# Patient Record
Sex: Female | Born: 1980 | Race: White | Hispanic: No | Marital: Single | Smoking: Former smoker
Health system: Southern US, Community
[De-identification: ages and names within clinical notes are randomized; demographics above are authoritative.]

## PROBLEM LIST (undated history)

## (undated) DIAGNOSIS — F419 Anxiety disorder, unspecified: Secondary | ICD-10-CM

## (undated) DIAGNOSIS — M4800 Spinal stenosis, site unspecified: Secondary | ICD-10-CM

## (undated) DIAGNOSIS — J45909 Unspecified asthma, uncomplicated: Secondary | ICD-10-CM

## (undated) DIAGNOSIS — R569 Unspecified convulsions: Secondary | ICD-10-CM

## (undated) HISTORY — PX: CERVICAL SPINE SURGERY: SHX589

## (undated) HISTORY — PX: TONSILLECTOMY: SUR1361

## (undated) HISTORY — PX: OTHER SURGICAL HISTORY: SHX169

## (undated) HISTORY — PX: SKIN GRAFT: SHX250

---

## 2004-04-26 ENCOUNTER — Emergency Department: Payer: Self-pay | Admitting: Emergency Medicine

## 2005-04-07 ENCOUNTER — Emergency Department: Payer: Self-pay | Admitting: General Practice

## 2006-01-08 ENCOUNTER — Emergency Department: Payer: Self-pay | Admitting: Emergency Medicine

## 2006-01-20 ENCOUNTER — Inpatient Hospital Stay: Payer: Self-pay | Admitting: Unknown Physician Specialty

## 2006-08-09 ENCOUNTER — Observation Stay: Payer: Self-pay | Admitting: Internal Medicine

## 2006-08-10 ENCOUNTER — Other Ambulatory Visit: Payer: Self-pay

## 2007-11-23 ENCOUNTER — Emergency Department: Payer: Self-pay | Admitting: Emergency Medicine

## 2010-05-12 ENCOUNTER — Emergency Department: Payer: Self-pay | Admitting: Emergency Medicine

## 2011-05-06 ENCOUNTER — Ambulatory Visit: Payer: Self-pay | Admitting: Unknown Physician Specialty

## 2012-04-17 ENCOUNTER — Observation Stay (HOSPITAL_COMMUNITY): Payer: 59

## 2012-04-17 ENCOUNTER — Observation Stay (HOSPITAL_COMMUNITY)
Admission: EM | Admit: 2012-04-17 | Discharge: 2012-04-19 | Disposition: A | Discharge status: Home / Self Care | Payer: 59 | Attending: General Surgery | Admitting: General Surgery

## 2012-04-17 ENCOUNTER — Emergency Department (HOSPITAL_COMMUNITY): Payer: 59

## 2012-04-17 ENCOUNTER — Encounter (HOSPITAL_COMMUNITY): Payer: Self-pay | Admitting: Emergency Medicine

## 2012-04-17 DIAGNOSIS — M546 Pain in thoracic spine: Secondary | ICD-10-CM | POA: Insufficient documentation

## 2012-04-17 DIAGNOSIS — S01501A Unspecified open wound of lip, initial encounter: Secondary | ICD-10-CM | POA: Insufficient documentation

## 2012-04-17 DIAGNOSIS — R1031 Right lower quadrant pain: Secondary | ICD-10-CM | POA: Insufficient documentation

## 2012-04-17 DIAGNOSIS — S301XXA Contusion of abdominal wall, initial encounter: Secondary | ICD-10-CM

## 2012-04-17 DIAGNOSIS — M25469 Effusion, unspecified knee: Secondary | ICD-10-CM | POA: Insufficient documentation

## 2012-04-17 DIAGNOSIS — J939 Pneumothorax, unspecified: Secondary | ICD-10-CM | POA: Diagnosis present

## 2012-04-17 DIAGNOSIS — IMO0002 Reserved for concepts with insufficient information to code with codable children: Secondary | ICD-10-CM | POA: Insufficient documentation

## 2012-04-17 DIAGNOSIS — S270XXA Traumatic pneumothorax, initial encounter: Secondary | ICD-10-CM | POA: Insufficient documentation

## 2012-04-17 DIAGNOSIS — H5789 Other specified disorders of eye and adnexa: Secondary | ICD-10-CM | POA: Insufficient documentation

## 2012-04-17 DIAGNOSIS — S3981XA Other specified injuries of abdomen, initial encounter: Secondary | ICD-10-CM | POA: Insufficient documentation

## 2012-04-17 DIAGNOSIS — S0081XA Abrasion of other part of head, initial encounter: Secondary | ICD-10-CM

## 2012-04-17 DIAGNOSIS — M542 Cervicalgia: Secondary | ICD-10-CM | POA: Insufficient documentation

## 2012-04-17 DIAGNOSIS — S139XXA Sprain of joints and ligaments of unspecified parts of neck, initial encounter: Principal | ICD-10-CM | POA: Insufficient documentation

## 2012-04-17 DIAGNOSIS — S161XXA Strain of muscle, fascia and tendon at neck level, initial encounter: Secondary | ICD-10-CM

## 2012-04-17 DIAGNOSIS — S01511A Laceration without foreign body of lip, initial encounter: Secondary | ICD-10-CM

## 2012-04-17 DIAGNOSIS — R220 Localized swelling, mass and lump, head: Secondary | ICD-10-CM | POA: Insufficient documentation

## 2012-04-17 DIAGNOSIS — G40909 Epilepsy, unspecified, not intractable, without status epilepticus: Secondary | ICD-10-CM | POA: Diagnosis present

## 2012-04-17 DIAGNOSIS — S60417A Abrasion of left little finger, initial encounter: Secondary | ICD-10-CM

## 2012-04-17 HISTORY — DX: Anxiety disorder, unspecified: F41.9

## 2012-04-17 HISTORY — DX: Unspecified convulsions: R56.9

## 2012-04-17 HISTORY — PX: OTHER SURGICAL HISTORY: SHX169

## 2012-04-17 HISTORY — DX: Unspecified asthma, uncomplicated: J45.909

## 2012-04-17 LAB — COMPREHENSIVE METABOLIC PANEL
AST: 66 U/L — ABNORMAL HIGH (ref 0–37)
Albumin: 3.9 g/dL (ref 3.5–5.2)
Alkaline Phosphatase: 65 U/L (ref 39–117)
Chloride: 104 mEq/L (ref 96–112)
Potassium: 3.3 mEq/L — ABNORMAL LOW (ref 3.5–5.1)
Total Bilirubin: 0.3 mg/dL (ref 0.3–1.2)

## 2012-04-17 LAB — URINE CULTURE: Colony Count: NO GROWTH

## 2012-04-17 LAB — RAPID URINE DRUG SCREEN, HOSP PERFORMED
Barbiturates: NOT DETECTED
Tetrahydrocannabinol: NOT DETECTED

## 2012-04-17 LAB — CBC
MCH: 27 pg (ref 26.0–34.0)
MCHC: 34 g/dL (ref 30.0–36.0)
MCV: 79.2 fL (ref 78.0–100.0)
Platelets: 228 10*3/uL (ref 150–400)
RDW: 16.1 % — ABNORMAL HIGH (ref 11.5–15.5)

## 2012-04-17 LAB — URINALYSIS, MICROSCOPIC ONLY
Bilirubin Urine: NEGATIVE
Ketones, ur: NEGATIVE mg/dL
Nitrite: NEGATIVE
Urobilinogen, UA: 1 mg/dL (ref 0.0–1.0)
pH: 6 (ref 5.0–8.0)

## 2012-04-17 LAB — POCT I-STAT, CHEM 8
Creatinine, Ser: 0.8 mg/dL (ref 0.50–1.10)
Hemoglobin: 13.6 g/dL (ref 12.0–15.0)
Sodium: 142 mEq/L (ref 135–145)
TCO2: 22 mmol/L (ref 0–100)

## 2012-04-17 LAB — CDS SEROLOGY

## 2012-04-17 LAB — PROTIME-INR
INR: 1.06 (ref 0.00–1.49)
Prothrombin Time: 13.7 seconds (ref 11.6–15.2)

## 2012-04-17 MED ORDER — FENTANYL CITRATE 0.05 MG/ML IJ SOLN: 100.0000 ug | Freq: Once | INTRAMUSCULAR | Status: AC

## 2012-04-17 MED ORDER — HYDROMORPHONE HCL PF 1 MG/ML IJ SOLN
0.5000 mg | INTRAMUSCULAR | Status: DC | PRN
  Administered 2012-04-17 (×3): 1 mg via INTRAVENOUS
  Filled 2012-04-17 (×3): qty 1

## 2012-04-17 MED ORDER — FENTANYL CITRATE 0.05 MG/ML IJ SOLN
INTRAMUSCULAR | Status: AC
  Administered 2012-04-17: 100 ug via INTRAVENOUS
  Filled 2012-04-17: qty 2

## 2012-04-17 MED ORDER — DIVALPROEX SODIUM ER 500 MG PO TB24
1000.0000 mg | ORAL_TABLET | Freq: Every day | ORAL | Status: DC
  Administered 2012-04-17 – 2012-04-19 (×3): 1000 mg via ORAL
  Filled 2012-04-17 (×3): qty 2

## 2012-04-17 MED ORDER — FENTANYL CITRATE 0.05 MG/ML IJ SOLN: 100.0000 ug | Freq: Once | INTRAMUSCULAR | Status: DC

## 2012-04-17 MED ORDER — ONDANSETRON HCL 4 MG/2ML IJ SOLN: 4.0000 mg | Freq: Four times a day (QID) | INTRAMUSCULAR | Status: DC | PRN

## 2012-04-17 MED ORDER — FENTANYL CITRATE 0.05 MG/ML IJ SOLN
100.0000 ug | Freq: Once | INTRAMUSCULAR | Status: AC
  Administered 2012-04-17: 100 ug via INTRAVENOUS

## 2012-04-17 MED ORDER — OXYCODONE HCL 5 MG PO TABS
5.0000 mg | ORAL_TABLET | ORAL | Status: DC | PRN
  Administered 2012-04-17: 5 mg via ORAL
  Filled 2012-04-17: qty 1

## 2012-04-17 MED ORDER — KCL IN DEXTROSE-NACL 20-5-0.45 MEQ/L-%-% IV SOLN
INTRAVENOUS | Status: DC
  Administered 2012-04-17: 18:00:00 via INTRAVENOUS
  Filled 2012-04-17 (×2): qty 1000

## 2012-04-17 MED ORDER — ONDANSETRON HCL 4 MG PO TABS: 4.0000 mg | ORAL_TABLET | Freq: Four times a day (QID) | ORAL | Status: DC | PRN

## 2012-04-17 MED ORDER — HYDROMORPHONE HCL PF 1 MG/ML IJ SOLN
0.5000 mg | INTRAMUSCULAR | Status: DC | PRN
  Administered 2012-04-18 (×2): 1 mg via INTRAVENOUS
  Filled 2012-04-17 (×2): qty 1

## 2012-04-17 MED ORDER — ALPRAZOLAM 0.5 MG PO TABS
1.0000 mg | ORAL_TABLET | Freq: Two times a day (BID) | ORAL | Status: DC | PRN
  Administered 2012-04-17 (×2): 1 mg via ORAL
  Filled 2012-04-17: qty 1
  Filled 2012-04-17: qty 4
  Filled 2012-04-17: qty 1
  Filled 2012-04-17: qty 4

## 2012-04-17 MED ORDER — PANTOPRAZOLE SODIUM 40 MG IV SOLR
40.0000 mg | Freq: Every day | INTRAVENOUS | Status: DC
  Filled 2012-04-17: qty 40

## 2012-04-17 MED ORDER — OXYCODONE HCL 5 MG PO TABS
10.0000 mg | ORAL_TABLET | ORAL | Status: DC | PRN
  Administered 2012-04-17: 10 mg via ORAL
  Filled 2012-04-17: qty 2

## 2012-04-17 MED ORDER — ACETAMINOPHEN 325 MG PO TABS: 650.0000 mg | ORAL_TABLET | ORAL | Status: DC | PRN

## 2012-04-17 MED ORDER — ENOXAPARIN SODIUM 40 MG/0.4ML ~~LOC~~ SOLN
40.0000 mg | SUBCUTANEOUS | Status: DC
  Administered 2012-04-18 – 2012-04-19 (×2): 40 mg via SUBCUTANEOUS
  Filled 2012-04-17 (×2): qty 0.4

## 2012-04-17 MED ORDER — OXYCODONE HCL 5 MG PO TABS
10.0000 mg | ORAL_TABLET | ORAL | Status: DC | PRN
  Administered 2012-04-17 – 2012-04-18 (×2): 10 mg via ORAL
  Filled 2012-04-17 (×2): qty 2

## 2012-04-17 MED ORDER — PANTOPRAZOLE SODIUM 40 MG PO TBEC
40.0000 mg | DELAYED_RELEASE_TABLET | Freq: Every day | ORAL | Status: DC
  Administered 2012-04-17: 40 mg via ORAL
  Filled 2012-04-17: qty 1

## 2012-04-17 MED ORDER — TIZANIDINE HCL 4 MG PO TABS
4.0000 mg | ORAL_TABLET | Freq: Three times a day (TID) | ORAL | Status: DC | PRN
  Administered 2012-04-17 – 2012-04-18 (×2): 4 mg via ORAL
  Filled 2012-04-17 (×3): qty 1

## 2012-04-17 MED ORDER — FENTANYL CITRATE 0.05 MG/ML IJ SOLN
INTRAMUSCULAR | Status: AC
  Filled 2012-04-17: qty 2

## 2012-04-17 MED ORDER — IOHEXOL 300 MG/ML  SOLN
80.0000 mL | Freq: Once | INTRAMUSCULAR | Status: AC | PRN
  Administered 2012-04-17: 80 mL via INTRAVENOUS

## 2012-04-17 MED ORDER — DOCUSATE SODIUM 100 MG PO CAPS
100.0000 mg | ORAL_CAPSULE | Freq: Two times a day (BID) | ORAL | Status: DC
  Administered 2012-04-17 – 2012-04-19 (×4): 100 mg via ORAL
  Filled 2012-04-17 (×6): qty 1

## 2012-04-17 NOTE — Progress Notes (Signed)
Orthopedic Tech Progress Note Patient Details:  Hannah Carlson April 04, 1980 [id number]  Ortho Devices Type of Ortho Device: Soft collar Ortho Device/Splint Location: neck Ortho Device/Splint Interventions: Ordered;Application   Jennye Moccasin 04/17/2012, 9:17 PM

## 2012-04-17 NOTE — ED Notes (Signed)
Pt returned from XR and on monitor. [facility] in room with patient

## 2012-04-17 NOTE — ED Notes (Signed)
Dr. Radford Pax speaking with Dr. Janee Morn
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
## 2012-04-17 NOTE — ED Notes (Signed)
Pt concerned pain medication given at this time will not be enough. Informed pt to give medication time to work and if still in pain let RN know.

## 2012-04-17 NOTE — ED Notes (Signed)
Results of lactic acid shown to Dr. Radford Pax

## 2012-04-17 NOTE — Progress Notes (Signed)
Patient ID: Hannah Carlson, female   DOB: [birth year], 32 y.o.   MRN: [id number] Flex ex neg.  Collar removed.  Place soft collar for comfort. Violeta Gelinas, MD, MPH, FACS Pager: [phone]

## 2012-04-17 NOTE — ED Notes (Signed)
Pt given cell phone from [facility] and [facility].
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
## 2012-04-17 NOTE — ED Notes (Signed)
Pt placed in [place] C-collar by RN and Dr.Thompson. Pt husband at bedside

## 2012-04-18 ENCOUNTER — Encounter (HOSPITAL_COMMUNITY): Payer: Self-pay | Admitting: General Practice

## 2012-04-18 DIAGNOSIS — G40909 Epilepsy, unspecified, not intractable, without status epilepticus: Secondary | ICD-10-CM

## 2012-04-18 DIAGNOSIS — D62 Acute posthemorrhagic anemia: Secondary | ICD-10-CM

## 2012-04-18 LAB — HEMOGLOBIN AND HEMATOCRIT, BLOOD: Hemoglobin: 9.4 g/dL — ABNORMAL LOW (ref 12.0–15.0)

## 2012-04-18 LAB — BASIC METABOLIC PANEL
BUN: 5 mg/dL — ABNORMAL LOW (ref 6–23)
CO2: 27 mEq/L (ref 19–32)
Calcium: 8.6 mg/dL (ref 8.4–10.5)
Chloride: 104 mEq/L (ref 96–112)
Creatinine, Ser: 0.66 mg/dL (ref 0.50–1.10)

## 2012-04-18 LAB — CBC
HCT: 29.2 % — ABNORMAL LOW (ref 36.0–46.0)
MCH: 27.1 pg (ref 26.0–34.0)
MCV: 81.6 fL (ref 78.0–100.0)
Platelets: 174 10*3/uL (ref 150–400)
RBC: 3.58 MIL/uL — ABNORMAL LOW (ref 3.87–5.11)
RDW: 16.3 % — ABNORMAL HIGH (ref 11.5–15.5)

## 2012-04-18 MED ORDER — OXYCODONE HCL 5 MG PO TABS
10.0000 mg | ORAL_TABLET | ORAL | Status: DC | PRN
  Administered 2012-04-18 – 2012-04-19 (×5): 20 mg via ORAL
  Filled 2012-04-18 (×5): qty 4

## 2012-04-18 MED ORDER — TIZANIDINE HCL 4 MG PO TABS
4.0000 mg | ORAL_TABLET | Freq: Three times a day (TID) | ORAL | Status: DC
  Administered 2012-04-18 – 2012-04-19 (×5): 4 mg via ORAL
  Filled 2012-04-18 (×7): qty 1

## 2012-04-18 MED ORDER — BACITRACIN ZINC 500 UNIT/GM EX OINT
TOPICAL_OINTMENT | Freq: Two times a day (BID) | CUTANEOUS | Status: DC
  Administered 2012-04-18 – 2012-04-19 (×3): via TOPICAL
  Filled 2012-04-18: qty 15

## 2012-04-18 MED ORDER — OXYCODONE HCL 5 MG PO TABS
5.0000 mg | ORAL_TABLET | Freq: Once | ORAL | Status: AC
  Administered 2012-04-18: 5 mg via ORAL
  Filled 2012-04-18: qty 1

## 2012-04-18 MED ORDER — HYDROMORPHONE HCL PF 1 MG/ML IJ SOLN
0.5000 mg | INTRAMUSCULAR | Status: DC | PRN
  Administered 2012-04-18: 0.5 mg via INTRAVENOUS
  Filled 2012-04-18: qty 1

## 2012-04-18 MED ORDER — OXYCODONE HCL 5 MG PO TABS
5.0000 mg | ORAL_TABLET | ORAL | Status: DC | PRN
  Administered 2012-04-18 (×2): 15 mg via ORAL
  Filled 2012-04-18 (×2): qty 3

## 2012-04-18 MED ORDER — NAPROXEN 500 MG PO TABS
500.0000 mg | ORAL_TABLET | Freq: Two times a day (BID) | ORAL | Status: DC
  Administered 2012-04-18 – 2012-04-19 (×2): 500 mg via ORAL
  Filled 2012-04-18 (×5): qty 1

## 2012-04-18 NOTE — Progress Notes (Signed)
Patient still very sleepy, will not open eyes.  Hemoglobin has dropped from 12.7 to 9.7 in 24 hours.  Unsure of the source.  Will review all scans.  Her abdominal wall hematoma/rectus hematoma does not appear to be that large.  This patient has been seen and I agree with the findings and treatment plan.  [name]. Gae Bon, MD, FACS [phone] (pager) [phone] (direct pager) Trauma Surgeon
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
## 2012-04-19 DIAGNOSIS — S161XXA Strain of muscle, fascia and tendon at neck level, initial encounter: Secondary | ICD-10-CM

## 2012-04-19 LAB — CBC
Hemoglobin: 10.1 g/dL — ABNORMAL LOW (ref 12.0–15.0)
MCH: 26.9 pg (ref 26.0–34.0)
MCHC: 33.2 g/dL (ref 30.0–36.0)
Platelets: 151 10*3/uL (ref 150–400)

## 2012-04-19 LAB — VALPROIC ACID LEVEL: Valproic Acid Lvl: 56.7 ug/mL (ref 50.0–100.0)

## 2012-04-19 MED ORDER — NAPROXEN 500 MG PO TABS: 500.0000 mg | ORAL_TABLET | Freq: Two times a day (BID) | ORAL | Status: DC

## 2012-04-19 MED ORDER — POLYETHYLENE GLYCOL 3350 17 GM/SCOOP PO POWD: 17.0000 g | Freq: Every day | ORAL | Status: DC

## 2012-04-19 MED ORDER — OXYCODONE-ACETAMINOPHEN 10-325 MG PO TABS: 1.0000 | ORAL_TABLET | ORAL | Status: AC | PRN

## 2012-04-19 MED ORDER — TIZANIDINE HCL 4 MG PO TABS: 4.0000 mg | ORAL_TABLET | Freq: Three times a day (TID) | ORAL | Status: DC | PRN

## 2012-04-19 MED ORDER — DSS 100 MG PO CAPS: 100.0000 mg | ORAL_CAPSULE | Freq: Two times a day (BID) | ORAL | Status: DC

## 2012-04-19 NOTE — Progress Notes (Signed)
Discharge instructions reviewed with pt and prescriptions given.  Pt verbalized understanding and questions answered.  Pt discharged in stable condition via wheelchair with husband.  Ethelene Closser Lindsay   
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
## 2012-04-19 NOTE — Progress Notes (Signed)
Ready for D/C, follow-up appointments arranged. Violeta Gelinas, MD, MPH, FACS Pager: [phone]
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
## 2012-04-19 NOTE — Discharge Summary (Signed)
Jamil Armwood, MD, MPH, FACS Pager: [phone]  
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
## 2013-09-10 ENCOUNTER — Emergency Department: Payer: Self-pay | Admitting: Emergency Medicine

## 2014-12-22 ENCOUNTER — Emergency Department
Admission: EM | Admit: 2014-12-22 | Discharge: 2014-12-22 | Disposition: A | Discharge status: Home / Self Care | Payer: 59 | Attending: Student | Admitting: Student

## 2014-12-22 DIAGNOSIS — Z87891 Personal history of nicotine dependence: Secondary | ICD-10-CM | POA: Diagnosis not present

## 2014-12-22 DIAGNOSIS — Z79899 Other long term (current) drug therapy: Secondary | ICD-10-CM | POA: Diagnosis not present

## 2014-12-22 DIAGNOSIS — L03211 Cellulitis of face: Secondary | ICD-10-CM | POA: Insufficient documentation

## 2014-12-22 DIAGNOSIS — R220 Localized swelling, mass and lump, head: Secondary | ICD-10-CM | POA: Diagnosis present

## 2014-12-22 MED ORDER — OXYCODONE-ACETAMINOPHEN 5-325 MG PO TABS: 1.0000 | ORAL_TABLET | Freq: Four times a day (QID) | ORAL | Status: DC | PRN

## 2014-12-22 MED ORDER — DOXYCYCLINE HYCLATE 100 MG PO CAPS: 100.0000 mg | ORAL_CAPSULE | Freq: Two times a day (BID) | ORAL | Status: DC

## 2014-12-22 NOTE — ED Notes (Signed)
Assessment per pa 

## 2014-12-22 NOTE — Discharge Instructions (Signed)

## 2014-12-22 NOTE — ED Notes (Signed)
Pt c/o swelling and redness to the right brow area for the past week, worse over night.
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
## 2014-12-25 ENCOUNTER — Telehealth: Payer: Self-pay | Admitting: Emergency Medicine

## 2014-12-25 NOTE — ED Notes (Signed)
Pt called and said her face feels better, but she has a hard  Knot under eyebrow and wants to know if she should pop it if it comes to a head.  She said she is using warm compresses and taking the antibiotics we gave her.  I told her not to pop or squeeze the area as this may make it worse.  She will continue to use the compresses.  She asked if she should return to the ED to have it drained.  i told her that she should start with her pcp or see the dermatologist as instructed.  I told her that i could not advise whether the knot needed to be drained, and that she should check with her pcp first, since the facial tenderness was subsiding.  She agrees.

## 2015-06-25 ENCOUNTER — Emergency Department: Payer: 59

## 2015-06-25 ENCOUNTER — Encounter: Payer: Self-pay | Admitting: Intensive Care

## 2015-06-25 ENCOUNTER — Emergency Department
Admission: EM | Admit: 2015-06-25 | Discharge: 2015-06-25 | Disposition: A | Discharge status: Home / Self Care | Payer: 59 | Attending: Emergency Medicine | Admitting: Emergency Medicine

## 2015-06-25 DIAGNOSIS — Z87891 Personal history of nicotine dependence: Secondary | ICD-10-CM | POA: Diagnosis not present

## 2015-06-25 DIAGNOSIS — R51 Headache: Secondary | ICD-10-CM | POA: Insufficient documentation

## 2015-06-25 DIAGNOSIS — Y999 Unspecified external cause status: Secondary | ICD-10-CM | POA: Diagnosis not present

## 2015-06-25 DIAGNOSIS — Z79899 Other long term (current) drug therapy: Secondary | ICD-10-CM | POA: Diagnosis not present

## 2015-06-25 DIAGNOSIS — Y939 Activity, unspecified: Secondary | ICD-10-CM | POA: Diagnosis not present

## 2015-06-25 DIAGNOSIS — J45909 Unspecified asthma, uncomplicated: Secondary | ICD-10-CM | POA: Diagnosis not present

## 2015-06-25 DIAGNOSIS — S60212A Contusion of left wrist, initial encounter: Secondary | ICD-10-CM | POA: Insufficient documentation

## 2015-06-25 DIAGNOSIS — M25512 Pain in left shoulder: Secondary | ICD-10-CM | POA: Insufficient documentation

## 2015-06-25 DIAGNOSIS — T07XXXA Unspecified multiple injuries, initial encounter: Secondary | ICD-10-CM

## 2015-06-25 DIAGNOSIS — Y92019 Unspecified place in single-family (private) house as the place of occurrence of the external cause: Secondary | ICD-10-CM | POA: Diagnosis not present

## 2015-06-25 DIAGNOSIS — Z9889 Other specified postprocedural states: Secondary | ICD-10-CM | POA: Diagnosis not present

## 2015-06-25 DIAGNOSIS — M542 Cervicalgia: Secondary | ICD-10-CM | POA: Diagnosis present

## 2015-06-25 DIAGNOSIS — Z8669 Personal history of other diseases of the nervous system and sense organs: Secondary | ICD-10-CM | POA: Insufficient documentation

## 2015-06-25 HISTORY — DX: Spinal stenosis, site unspecified: M48.00

## 2015-06-25 LAB — URINALYSIS COMPLETE WITH MICROSCOPIC (ARMC ONLY)
BILIRUBIN URINE: NEGATIVE
GLUCOSE, UA: NEGATIVE mg/dL
HGB URINE DIPSTICK: NEGATIVE
Ketones, ur: NEGATIVE mg/dL
Nitrite: NEGATIVE
PH: 6 (ref 5.0–8.0)
Protein, ur: 100 mg/dL — AB
Specific Gravity, Urine: 1.016 (ref 1.005–1.030)

## 2015-06-25 LAB — PREGNANCY, URINE: PREG TEST UR: NEGATIVE

## 2015-06-25 MED ORDER — MORPHINE SULFATE (PF) 4 MG/ML IV SOLN
4.0000 mg | Freq: Once | INTRAVENOUS | Status: AC
  Administered 2015-06-25: 4 mg via INTRAVENOUS
  Filled 2015-06-25: qty 1

## 2015-06-25 MED ORDER — ONDANSETRON HCL 4 MG/2ML IJ SOLN
4.0000 mg | Freq: Once | INTRAMUSCULAR | Status: AC
  Administered 2015-06-25: 4 mg via INTRAVENOUS
  Filled 2015-06-25: qty 2
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
## 2016-01-03 ENCOUNTER — Emergency Department: Payer: 59

## 2016-01-03 ENCOUNTER — Emergency Department
Admission: EM | Admit: 2016-01-03 | Discharge: 2016-01-03 | Disposition: A | Discharge status: Home / Self Care | Payer: 59 | Attending: Emergency Medicine | Admitting: Emergency Medicine

## 2016-01-03 ENCOUNTER — Encounter: Payer: Self-pay | Admitting: Intensive Care

## 2016-01-03 DIAGNOSIS — Z87891 Personal history of nicotine dependence: Secondary | ICD-10-CM | POA: Insufficient documentation

## 2016-01-03 DIAGNOSIS — R930 Abnormal findings on diagnostic imaging of skull and head, not elsewhere classified: Secondary | ICD-10-CM | POA: Diagnosis not present

## 2016-01-03 DIAGNOSIS — T148XXA Other injury of unspecified body region, initial encounter: Secondary | ICD-10-CM | POA: Diagnosis not present

## 2016-01-03 DIAGNOSIS — J45909 Unspecified asthma, uncomplicated: Secondary | ICD-10-CM | POA: Diagnosis not present

## 2016-01-03 DIAGNOSIS — Y9389 Activity, other specified: Secondary | ICD-10-CM | POA: Diagnosis not present

## 2016-01-03 DIAGNOSIS — R040 Epistaxis: Secondary | ICD-10-CM

## 2016-01-03 DIAGNOSIS — Y999 Unspecified external cause status: Secondary | ICD-10-CM | POA: Diagnosis not present

## 2016-01-03 DIAGNOSIS — S0083XA Contusion of other part of head, initial encounter: Secondary | ICD-10-CM | POA: Diagnosis not present

## 2016-01-03 DIAGNOSIS — Y92410 Unspecified street and highway as the place of occurrence of the external cause: Secondary | ICD-10-CM | POA: Diagnosis not present

## 2016-01-03 DIAGNOSIS — S0993XA Unspecified injury of face, initial encounter: Secondary | ICD-10-CM | POA: Diagnosis present

## 2016-01-03 LAB — URINE DRUG SCREEN, QUALITATIVE (ARMC ONLY)
AMPHETAMINES, UR SCREEN: NOT DETECTED
Barbiturates, Ur Screen: NOT DETECTED
Benzodiazepine, Ur Scrn: NOT DETECTED
Cannabinoid 50 Ng, Ur ~~LOC~~: NOT DETECTED
Cocaine Metabolite,Ur ~~LOC~~: NOT DETECTED
MDMA (ECSTASY) UR SCREEN: NOT DETECTED
METHADONE SCREEN, URINE: NOT DETECTED
OPIATE, UR SCREEN: NOT DETECTED
PHENCYCLIDINE (PCP) UR S: NOT DETECTED
Tricyclic, Ur Screen: NOT DETECTED

## 2016-01-03 LAB — CBC WITH DIFFERENTIAL/PLATELET
Basophils Absolute: 0 10*3/uL (ref 0–0.1)
Basophils Relative: 0 %
Eosinophils Absolute: 0 10*3/uL (ref 0–0.7)
Eosinophils Relative: 0 %
HEMATOCRIT: 40.9 % (ref 35.0–47.0)
HEMOGLOBIN: 13.7 g/dL (ref 12.0–16.0)
LYMPHS ABS: 1.5 10*3/uL (ref 1.0–3.6)
LYMPHS PCT: 14 %
MCH: 28.4 pg (ref 26.0–34.0)
MCHC: 33.6 g/dL (ref 32.0–36.0)
MCV: 84.5 fL (ref 80.0–100.0)
MONOS PCT: 8 %
Monocytes Absolute: 0.8 10*3/uL (ref 0.2–0.9)
NEUTROS ABS: 8.1 10*3/uL — AB (ref 1.4–6.5)
NEUTROS PCT: 78 %
Platelets: 230 10*3/uL (ref 150–440)
RBC: 4.84 MIL/uL (ref 3.80–5.20)
RDW: 14.6 % — ABNORMAL HIGH (ref 11.5–14.5)
WBC: 10.4 10*3/uL (ref 3.6–11.0)

## 2016-01-03 LAB — COMPREHENSIVE METABOLIC PANEL
ALBUMIN: 4.3 g/dL (ref 3.5–5.0)
ALT: 13 U/L — ABNORMAL LOW (ref 14–54)
AST: 25 U/L (ref 15–41)
Alkaline Phosphatase: 60 U/L (ref 38–126)
Anion gap: 6 (ref 5–15)
BILIRUBIN TOTAL: 0.2 mg/dL — AB (ref 0.3–1.2)
BUN: 8 mg/dL (ref 6–20)
CHLORIDE: 103 mmol/L (ref 101–111)
CO2: 29 mmol/L (ref 22–32)
Calcium: 9.1 mg/dL (ref 8.9–10.3)
Creatinine, Ser: 0.55 mg/dL (ref 0.44–1.00)
GFR calc Af Amer: >60 mL/min (ref >60)
GFR calc non Af Amer: >60 mL/min (ref >60)
Glucose, Bld: 102 mg/dL — ABNORMAL HIGH (ref 65–99)
POTASSIUM: 3.6 mmol/L (ref 3.5–5.1)
Sodium: 138 mmol/L (ref 135–145)
TOTAL PROTEIN: 8.4 g/dL — AB (ref 6.5–8.1)

## 2016-01-03 LAB — LIPASE, BLOOD: Lipase: 21 U/L (ref 11–51)

## 2016-01-03 LAB — VALPROIC ACID LEVEL: Valproic Acid Lvl: 57 ug/mL (ref 50.0–100.0)

## 2016-01-03 LAB — POC URINE PREG, ED: Preg Test, Ur: NEGATIVE

## 2016-01-03 LAB — ETHANOL: Alcohol, Ethyl (B): <5 mg/dL (ref <5)

## 2016-01-03 MED ORDER — MORPHINE SULFATE (PF) 4 MG/ML IV SOLN
4.0000 mg | Freq: Once | INTRAVENOUS | Status: AC
  Administered 2016-01-03: 4 mg via INTRAVENOUS
  Filled 2016-01-03: qty 1

## 2016-01-03 MED ORDER — OXYCODONE-ACETAMINOPHEN 5-325 MG PO TABS
ORAL_TABLET | ORAL | Status: AC
  Filled 2016-01-03: qty 2

## 2016-01-03 MED ORDER — ONDANSETRON 4 MG PO TBDP: ORAL_TABLET | ORAL | 0 refills | Status: DC

## 2016-01-03 MED ORDER — OXYCODONE-ACETAMINOPHEN 5-325 MG PO TABS
2.0000 | ORAL_TABLET | Freq: Once | ORAL | Status: AC
  Administered 2016-01-03: 2 via ORAL

## 2016-01-03 MED ORDER — OXYCODONE-ACETAMINOPHEN 5-325 MG PO TABS: 1.0000 | ORAL_TABLET | ORAL | 0 refills | Status: DC | PRN

## 2016-01-03 MED ORDER — TRANEXAMIC ACID 1000 MG/10ML IV SOLN
500.0000 mg | Freq: Once | INTRAVENOUS | Status: AC
  Administered 2016-01-03: 500 mg via TOPICAL
  Filled 2016-01-03: qty 10

## 2016-01-03 MED ORDER — DOCUSATE SODIUM 100 MG PO CAPS: ORAL_CAPSULE | ORAL | 0 refills | Status: DC

## 2016-01-03 MED ORDER — ONDANSETRON HCL 4 MG/2ML IJ SOLN
4.0000 mg | Freq: Once | INTRAMUSCULAR | Status: AC
Start: 2016-01-03 — End: 2016-01-03
  Administered 2016-01-03: 4 mg via INTRAVENOUS
  Filled 2016-01-03: qty 2
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
## 2016-01-03 NOTE — ED Notes (Signed)
Pt. Also c/o right pain to elbow, sharp stabbing pain. ROM intact
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
## 2016-01-03 NOTE — ED Notes (Signed)
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
## 2018-01-06 ENCOUNTER — Emergency Department (HOSPITAL_COMMUNITY): Payer: 59

## 2018-01-06 ENCOUNTER — Emergency Department (HOSPITAL_COMMUNITY)
Admission: EM | Admit: 2018-01-06 | Discharge: 2018-01-06 | Disposition: A | Discharge status: Home / Self Care | Payer: 59 | Attending: Emergency Medicine | Admitting: Emergency Medicine

## 2018-01-06 ENCOUNTER — Encounter (HOSPITAL_COMMUNITY): Payer: Self-pay

## 2018-01-06 DIAGNOSIS — M542 Cervicalgia: Secondary | ICD-10-CM | POA: Diagnosis present

## 2018-01-06 DIAGNOSIS — Y939 Activity, unspecified: Secondary | ICD-10-CM | POA: Insufficient documentation

## 2018-01-06 DIAGNOSIS — Z87891 Personal history of nicotine dependence: Secondary | ICD-10-CM | POA: Insufficient documentation

## 2018-01-06 DIAGNOSIS — R410 Disorientation, unspecified: Secondary | ICD-10-CM | POA: Diagnosis not present

## 2018-01-06 DIAGNOSIS — Z79899 Other long term (current) drug therapy: Secondary | ICD-10-CM | POA: Insufficient documentation

## 2018-01-06 DIAGNOSIS — J45909 Unspecified asthma, uncomplicated: Secondary | ICD-10-CM | POA: Insufficient documentation

## 2018-01-06 DIAGNOSIS — Y998 Other external cause status: Secondary | ICD-10-CM | POA: Diagnosis not present

## 2018-01-06 DIAGNOSIS — Y92410 Unspecified street and highway as the place of occurrence of the external cause: Secondary | ICD-10-CM | POA: Insufficient documentation

## 2018-01-06 LAB — COMPREHENSIVE METABOLIC PANEL
ALT: 10 U/L (ref 0–44)
AST: 18 U/L (ref 15–41)
Albumin: 3.1 g/dL — ABNORMAL LOW (ref 3.5–5.0)
Alkaline Phosphatase: 53 U/L (ref 38–126)
Anion gap: 12 (ref 5–15)
BUN: 7 mg/dL (ref 6–20)
CO2: 21 mmol/L — ABNORMAL LOW (ref 22–32)
Calcium: 8.2 mg/dL — ABNORMAL LOW (ref 8.9–10.3)
Chloride: 104 mmol/L (ref 98–111)
Creatinine, Ser: 0.75 mg/dL (ref 0.44–1.00)
GFR calc Af Amer: >60 mL/min (ref >60)
GFR calc non Af Amer: >60 mL/min (ref >60)
Glucose, Bld: 98 mg/dL (ref 70–99)
POTASSIUM: 3.8 mmol/L (ref 3.5–5.1)
Sodium: 137 mmol/L (ref 135–145)
TOTAL PROTEIN: 6.3 g/dL — AB (ref 6.5–8.1)
Total Bilirubin: 0.7 mg/dL (ref 0.3–1.2)

## 2018-01-06 LAB — I-STAT BETA HCG BLOOD, ED (MC, WL, AP ONLY): I-stat hCG, quantitative: <5 m[IU]/mL (ref <5)

## 2018-01-06 LAB — CBC WITH DIFFERENTIAL/PLATELET
Abs Immature Granulocytes: 0.04 10*3/uL (ref 0.00–0.07)
Basophils Absolute: 0.1 10*3/uL (ref 0.0–0.1)
Basophils Relative: 0 %
EOS ABS: 0 10*3/uL (ref 0.0–0.5)
Eosinophils Relative: 0 %
HCT: 39.5 % (ref 36.0–46.0)
Hemoglobin: 12.3 g/dL (ref 12.0–15.0)
Immature Granulocytes: 0 %
Lymphocytes Relative: 17 %
Lymphs Abs: 2.3 10*3/uL (ref 0.7–4.0)
MCH: 26.5 pg (ref 26.0–34.0)
MCHC: 31.1 g/dL (ref 30.0–36.0)
MCV: 85.1 fL (ref 80.0–100.0)
Monocytes Absolute: 0.8 10*3/uL (ref 0.1–1.0)
Monocytes Relative: 6 %
NRBC: 0 % (ref 0.0–0.2)
Neutro Abs: 10.1 10*3/uL — ABNORMAL HIGH (ref 1.7–7.7)
Neutrophils Relative %: 77 %
Platelets: 229 10*3/uL (ref 150–400)
RBC: 4.64 MIL/uL (ref 3.87–5.11)
RDW: 13.3 % (ref 11.5–15.5)
WBC: 13.3 10*3/uL — ABNORMAL HIGH (ref 4.0–10.5)

## 2018-01-06 MED ORDER — ONDANSETRON HCL 4 MG/2ML IJ SOLN: 4.0000 mg | Freq: Once | INTRAMUSCULAR | Status: DC

## 2018-01-06 MED ORDER — DIVALPROEX SODIUM 250 MG PO DR TAB
1000.0000 mg | DELAYED_RELEASE_TABLET | Freq: Once | ORAL | Status: DC
  Filled 2018-01-06: qty 4

## 2018-01-06 NOTE — ED Notes (Signed)
Patient verbalizes understanding of discharge instructions. Opportunity for questioning and answers were provided. Armband removed by staff, pt discharged from ED. Wheeled out into lobby  
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
## 2018-10-30 ENCOUNTER — Ambulatory Visit: Payer: Managed Care, Other (non HMO) | Attending: Nurse Practitioner

## 2019-01-09 ENCOUNTER — Encounter: Payer: Self-pay | Admitting: Emergency Medicine

## 2019-01-09 ENCOUNTER — Emergency Department: Payer: Managed Care, Other (non HMO)

## 2019-01-09 ENCOUNTER — Emergency Department
Admission: EM | Admit: 2019-01-09 | Discharge: 2019-01-09 | Disposition: A | Discharge status: Home / Self Care | Payer: Managed Care, Other (non HMO) | Attending: Emergency Medicine | Admitting: Emergency Medicine

## 2019-01-09 ENCOUNTER — Other Ambulatory Visit: Payer: Self-pay

## 2019-01-09 DIAGNOSIS — U071 COVID-19: Secondary | ICD-10-CM

## 2019-01-09 DIAGNOSIS — Z87891 Personal history of nicotine dependence: Secondary | ICD-10-CM | POA: Diagnosis not present

## 2019-01-09 DIAGNOSIS — J45909 Unspecified asthma, uncomplicated: Secondary | ICD-10-CM | POA: Insufficient documentation

## 2019-01-09 DIAGNOSIS — R05 Cough: Secondary | ICD-10-CM | POA: Diagnosis present

## 2019-01-09 LAB — INFLUENZA PANEL BY PCR (TYPE A & B)
Influenza A By PCR: NEGATIVE
Influenza B By PCR: NEGATIVE

## 2019-01-09 LAB — POC SARS CORONAVIRUS 2 AG: SARS Coronavirus 2 Ag: POSITIVE — AB

## 2019-01-09 MED ORDER — AZITHROMYCIN 500 MG PO TABS
500.0000 mg | ORAL_TABLET | Freq: Once | ORAL | Status: AC
  Administered 2019-01-09: 500 mg via ORAL
  Filled 2019-01-09: qty 1

## 2019-01-09 MED ORDER — PREDNISONE 20 MG PO TABS
60.0000 mg | ORAL_TABLET | Freq: Once | ORAL | Status: AC
  Administered 2019-01-09: 60 mg via ORAL
  Filled 2019-01-09: qty 3

## 2019-01-09 MED ORDER — ALBUTEROL SULFATE HFA 108 (90 BASE) MCG/ACT IN AERS: 2.0000 | INHALATION_SPRAY | RESPIRATORY_TRACT | 0 refills | Status: AC | PRN

## 2019-01-09 MED ORDER — PREDNISONE 20 MG PO TABS: ORAL_TABLET | ORAL | 0 refills | Status: AC

## 2019-01-09 MED ORDER — ACETAMINOPHEN 325 MG PO TABS
650.0000 mg | ORAL_TABLET | Freq: Once | ORAL | Status: AC
  Administered 2019-01-09: 650 mg via ORAL
  Filled 2019-01-09: qty 2

## 2019-01-09 MED ORDER — AZITHROMYCIN 250 MG PO TABS: 250.0000 mg | ORAL_TABLET | Freq: Every day | ORAL | 0 refills | Status: AC

## 2019-01-09 NOTE — Discharge Instructions (Addendum)
You have been tested and confirmed to be COVID positive. Take the prescription meds as prescribed. Continue to monitor and treat fevers with Tylenol. Follow-up with your provider or return to the ED for worsening symptoms.

## 2019-01-09 NOTE — ED Triage Notes (Signed)
Pt reports fever with non productive cough for the last 2 days and a stuffy nose.

## 2019-01-09 NOTE — ED Triage Notes (Signed)
Pt states needs a COVID test for work as well.

## 2019-01-09 NOTE — ED Notes (Signed)
EKG to EDP [place].  
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
## 2019-01-09 NOTE — ED Notes (Signed)
Pt d/c by previous RN. Educated by previous Therapist, sports.

## 2019-01-10 ENCOUNTER — Encounter: Payer: Self-pay | Admitting: Infectious Diseases

## 2019-01-10 LAB — SARS CORONAVIRUS 2 (TAT 6-24 HRS): SARS Coronavirus 2: POSITIVE — AB

## 2019-03-19 ENCOUNTER — Ambulatory Visit: Payer: Managed Care, Other (non HMO) | Admitting: Physical Therapy

## 2019-03-22 ENCOUNTER — Ambulatory Visit: Payer: Managed Care, Other (non HMO) | Admitting: Physical Therapy

## 2019-03-26 ENCOUNTER — Encounter: Payer: Managed Care, Other (non HMO) | Admitting: Physical Therapy

## 2019-03-28 ENCOUNTER — Encounter: Payer: Managed Care, Other (non HMO) | Admitting: Physical Therapy

## 2019-04-02 ENCOUNTER — Encounter: Payer: Managed Care, Other (non HMO) | Admitting: Physical Therapy

## 2019-04-04 ENCOUNTER — Encounter: Payer: Managed Care, Other (non HMO) | Admitting: Physical Therapy

## 2019-04-09 ENCOUNTER — Encounter: Payer: Managed Care, Other (non HMO) | Admitting: Physical Therapy

## 2019-04-11 ENCOUNTER — Encounter: Payer: Managed Care, Other (non HMO) | Admitting: Physical Therapy

## 2019-04-16 ENCOUNTER — Encounter: Payer: Managed Care, Other (non HMO) | Admitting: Physical Therapy

## 2019-12-18 DIAGNOSIS — O0990 Supervision of high risk pregnancy, unspecified, unspecified trimester: Secondary | ICD-10-CM | POA: Diagnosis not present

## 2019-12-18 DIAGNOSIS — Z3201 Encounter for pregnancy test, result positive: Secondary | ICD-10-CM | POA: Diagnosis not present

## 2019-12-18 DIAGNOSIS — Z3A01 Less than 8 weeks gestation of pregnancy: Secondary | ICD-10-CM | POA: Diagnosis not present

## 2019-12-26 DIAGNOSIS — G40311 Generalized idiopathic epilepsy and epileptic syndromes, intractable, with status epilepticus: Secondary | ICD-10-CM | POA: Diagnosis not present

## 2020-03-02 DIAGNOSIS — Z3A18 18 weeks gestation of pregnancy: Secondary | ICD-10-CM | POA: Diagnosis not present

## 2020-03-02 DIAGNOSIS — O09522 Supervision of elderly multigravida, second trimester: Secondary | ICD-10-CM | POA: Diagnosis not present

## 2020-03-02 DIAGNOSIS — G40909 Epilepsy, unspecified, not intractable, without status epilepticus: Secondary | ICD-10-CM | POA: Diagnosis not present

## 2020-03-02 DIAGNOSIS — Z3689 Encounter for other specified antenatal screening: Secondary | ICD-10-CM | POA: Diagnosis not present

## 2020-03-02 DIAGNOSIS — O99352 Diseases of the nervous system complicating pregnancy, second trimester: Secondary | ICD-10-CM | POA: Diagnosis not present

## 2020-03-31 ENCOUNTER — Other Ambulatory Visit: Payer: Self-pay

## 2020-03-31 ENCOUNTER — Encounter: Payer: Self-pay | Admitting: Emergency Medicine

## 2020-03-31 ENCOUNTER — Emergency Department
Admission: EM | Admit: 2020-03-31 | Discharge: 2020-04-01 | Disposition: A | Discharge status: Home / Self Care | Payer: Managed Care, Other (non HMO) | Attending: Emergency Medicine | Admitting: Emergency Medicine

## 2020-03-31 DIAGNOSIS — J45909 Unspecified asthma, uncomplicated: Secondary | ICD-10-CM | POA: Insufficient documentation

## 2020-03-31 DIAGNOSIS — Z3A22 22 weeks gestation of pregnancy: Secondary | ICD-10-CM | POA: Diagnosis not present

## 2020-03-31 DIAGNOSIS — Z87891 Personal history of nicotine dependence: Secondary | ICD-10-CM | POA: Diagnosis not present

## 2020-03-31 DIAGNOSIS — O2342 Unspecified infection of urinary tract in pregnancy, second trimester: Secondary | ICD-10-CM | POA: Insufficient documentation

## 2020-03-31 DIAGNOSIS — N390 Urinary tract infection, site not specified: Secondary | ICD-10-CM

## 2020-03-31 DIAGNOSIS — O26812 Pregnancy related exhaustion and fatigue, second trimester: Secondary | ICD-10-CM

## 2020-03-31 LAB — URINALYSIS, COMPLETE (UACMP) WITH MICROSCOPIC
Bilirubin Urine: NEGATIVE
Glucose, UA: NEGATIVE mg/dL
Hgb urine dipstick: NEGATIVE
Ketones, ur: 5 mg/dL — AB
Nitrite: NEGATIVE
Protein, ur: NEGATIVE mg/dL
Specific Gravity, Urine: 1.02 (ref 1.005–1.030)
pH: 6 (ref 5.0–8.0)

## 2020-03-31 LAB — BASIC METABOLIC PANEL
Anion gap: 9 (ref 5–15)
BUN: <5 mg/dL — ABNORMAL LOW (ref 6–20)
CO2: 21 mmol/L — ABNORMAL LOW (ref 22–32)
Calcium: 8.5 mg/dL — ABNORMAL LOW (ref 8.9–10.3)
Chloride: 105 mmol/L (ref 98–111)
Creatinine, Ser: 0.62 mg/dL (ref 0.44–1.00)
GFR, Estimated: >60 mL/min (ref >60)
Glucose, Bld: 141 mg/dL — ABNORMAL HIGH (ref 70–99)
Potassium: 3.4 mmol/L — ABNORMAL LOW (ref 3.5–5.1)
Sodium: 135 mmol/L (ref 135–145)

## 2020-03-31 LAB — CBC
HCT: 32.3 % — ABNORMAL LOW (ref 36.0–46.0)
Hemoglobin: 10.6 g/dL — ABNORMAL LOW (ref 12.0–15.0)
MCH: 27.6 pg (ref 26.0–34.0)
MCHC: 32.8 g/dL (ref 30.0–36.0)
MCV: 84.1 fL (ref 80.0–100.0)
Platelets: 277 10*3/uL (ref 150–400)
RBC: 3.84 MIL/uL — ABNORMAL LOW (ref 3.87–5.11)
RDW: 13.4 % (ref 11.5–15.5)
WBC: 10 10*3/uL (ref 4.0–10.5)
nRBC: 0 % (ref 0.0–0.2)

## 2020-03-31 LAB — CBG MONITORING, ED: Glucose-Capillary: 171 mg/dL — ABNORMAL HIGH (ref 70–99)
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
## 2020-04-01 LAB — TSH: TSH: 2.928 u[IU]/mL (ref 0.350–4.500)

## 2020-04-01 MED ORDER — CEPHALEXIN 500 MG PO CAPS
500.0000 mg | ORAL_CAPSULE | Freq: Once | ORAL | Status: AC
  Administered 2020-04-01: 500 mg via ORAL
  Filled 2020-04-01: qty 1

## 2020-04-01 MED ORDER — CEPHALEXIN 500 MG PO CAPS: 500.0000 mg | ORAL_CAPSULE | Freq: Two times a day (BID) | ORAL | 0 refills | Status: DC

## 2020-04-01 NOTE — Discharge Instructions (Addendum)
I recommend you continue your prenatal vitamins with iron.  I recommend increase water intake throughout the day.  Please follow-up closely with your primary care physician and OB/GYN if you continue to have symptoms of severe fatigue.  Your labs, EKG today were reassuring.  You may have a mild urinary tract infection which we are giving you antibiotics for.  Your thyroid function test is pending.  If this is abnormal, I will contact you at home.  You may also follow-up on this result through MyChart.

## 2020-04-01 NOTE — ED Notes (Signed)
ED provider at bedside.

## 2020-04-02 LAB — URINE CULTURE

## 2020-05-11 DIAGNOSIS — O99353 Diseases of the nervous system complicating pregnancy, third trimester: Secondary | ICD-10-CM | POA: Diagnosis not present

## 2020-05-11 DIAGNOSIS — O26813 Pregnancy related exhaustion and fatigue, third trimester: Secondary | ICD-10-CM | POA: Diagnosis not present

## 2020-05-11 DIAGNOSIS — O09523 Supervision of elderly multigravida, third trimester: Secondary | ICD-10-CM | POA: Diagnosis not present

## 2020-05-11 DIAGNOSIS — G40311 Generalized idiopathic epilepsy and epileptic syndromes, intractable, with status epilepticus: Secondary | ICD-10-CM | POA: Diagnosis not present

## 2020-05-26 ENCOUNTER — Encounter: Payer: Self-pay | Admitting: Obstetrics and Gynecology

## 2020-05-26 ENCOUNTER — Other Ambulatory Visit: Payer: Self-pay

## 2020-05-26 ENCOUNTER — Observation Stay
Admission: EM | Admit: 2020-05-26 | Discharge: 2020-05-26 | Disposition: A | Discharge status: Home / Self Care | Payer: Managed Care, Other (non HMO) | Attending: Obstetrics and Gynecology | Admitting: Obstetrics and Gynecology

## 2020-05-26 DIAGNOSIS — O99891 Other specified diseases and conditions complicating pregnancy: Secondary | ICD-10-CM

## 2020-05-26 DIAGNOSIS — Z3A30 30 weeks gestation of pregnancy: Secondary | ICD-10-CM | POA: Diagnosis not present

## 2020-05-26 DIAGNOSIS — R109 Unspecified abdominal pain: Secondary | ICD-10-CM | POA: Insufficient documentation

## 2020-05-26 DIAGNOSIS — O26893 Other specified pregnancy related conditions, third trimester: Principal | ICD-10-CM | POA: Insufficient documentation

## 2020-05-26 DIAGNOSIS — O26899 Other specified pregnancy related conditions, unspecified trimester: Secondary | ICD-10-CM

## 2020-05-26 DIAGNOSIS — M5430 Sciatica, unspecified side: Secondary | ICD-10-CM

## 2020-05-26 DIAGNOSIS — R102 Pelvic and perineal pain: Secondary | ICD-10-CM

## 2020-05-26 LAB — COMPREHENSIVE METABOLIC PANEL
ALT: 13 U/L (ref 0–44)
AST: 18 U/L (ref 15–41)
Albumin: 2.8 g/dL — ABNORMAL LOW (ref 3.5–5.0)
Alkaline Phosphatase: 147 U/L — ABNORMAL HIGH (ref 38–126)
Anion gap: 7 (ref 5–15)
BUN: 8 mg/dL (ref 6–20)
CO2: 23 mmol/L (ref 22–32)
Calcium: 8.3 mg/dL — ABNORMAL LOW (ref 8.9–10.3)
Chloride: 106 mmol/L (ref 98–111)
Creatinine, Ser: 0.49 mg/dL (ref 0.44–1.00)
GFR, Estimated: >60 mL/min (ref >60)
Glucose, Bld: 80 mg/dL (ref 70–99)
Potassium: 3.4 mmol/L — ABNORMAL LOW (ref 3.5–5.1)
Sodium: 136 mmol/L (ref 135–145)
Total Bilirubin: 0.6 mg/dL (ref 0.3–1.2)
Total Protein: 6.5 g/dL (ref 6.5–8.1)

## 2020-05-26 LAB — CBC
HCT: 29.1 % — ABNORMAL LOW (ref 36.0–46.0)
Hemoglobin: 9 g/dL — ABNORMAL LOW (ref 12.0–15.0)
MCH: 23.9 pg — ABNORMAL LOW (ref 26.0–34.0)
MCHC: 30.9 g/dL (ref 30.0–36.0)
MCV: 77.4 fL — ABNORMAL LOW (ref 80.0–100.0)
Platelets: 274 10*3/uL (ref 150–400)
RBC: 3.76 MIL/uL — ABNORMAL LOW (ref 3.87–5.11)
RDW: 14.7 % (ref 11.5–15.5)
WBC: 8.2 10*3/uL (ref 4.0–10.5)
nRBC: 0 % (ref 0.0–0.2)

## 2020-05-26 LAB — URINE DRUG SCREEN, QUALITATIVE (ARMC ONLY)
Amphetamines, Ur Screen: NOT DETECTED
Barbiturates, Ur Screen: NOT DETECTED
Benzodiazepine, Ur Scrn: NOT DETECTED
Cannabinoid 50 Ng, Ur ~~LOC~~: NOT DETECTED
Cocaine Metabolite,Ur ~~LOC~~: NOT DETECTED
MDMA (Ecstasy)Ur Screen: NOT DETECTED
Methadone Scn, Ur: NOT DETECTED
Opiate, Ur Screen: NOT DETECTED
Phencyclidine (PCP) Ur S: NOT DETECTED
Tricyclic, Ur Screen: NOT DETECTED

## 2020-05-26 LAB — URINALYSIS, ROUTINE W REFLEX MICROSCOPIC
Bacteria, UA: NONE SEEN
Bilirubin Urine: NEGATIVE
Glucose, UA: NEGATIVE mg/dL
Hgb urine dipstick: NEGATIVE
Ketones, ur: 20 mg/dL — AB
Nitrite: NEGATIVE
Protein, ur: 30 mg/dL — AB
Specific Gravity, Urine: 1.026 (ref 1.005–1.030)
Squamous Epithelial / HPF: >50 — ABNORMAL HIGH (ref 0–5)
pH: 5 (ref 5.0–8.0)

## 2020-05-26 LAB — LIPASE, BLOOD: Lipase: 28 U/L (ref 11–51)

## 2020-05-26 MED ORDER — ACETAMINOPHEN 325 MG PO TABS: 650.0000 mg | ORAL_TABLET | ORAL | Status: DC | PRN

## 2020-05-26 NOTE — Discharge Summary (Signed)
Discharge instructions provided to patient. Patient verbalized understanding. Red flag signs reviewed by RN. Patient discharged home.

## 2020-05-26 NOTE — Discharge Summary (Signed)
Physician Final Progress Note  Patient ID: Hannah Carlson MRN: [id number] DOB/AGE: [birth year] 40 y.o.  Admit date: 05/26/2020 Admitting provider: Vena Austria, MD Discharge date: 05/26/2020   Admission Diagnoses: Labor and delivery indication for care  Discharge Diagnoses:  Active Problems:   Labor and delivery indication for care or intervention   40 y.o. [id number] at [redacted]w[redacted]d by Estimated Date of Delivery: 08/01/20 who has been getting regular prenatal care by [facility].  Last visit was 05/11/2020 with follow up scheduled for 06/01/2020.  The patient left work today at around 2 PM with right flank and abdominal pain.  No other GI or GU symptoms.  She was treated for UTI previously this pregnancy.  She denies fevers or chills.  No abdominal trauma or inciting event.  Does report fatigue.  +FM, no LOF, no VB, no contractions.  Evaluation included CMP, CBC, Lipase, as well as UA which were normal.  She displayed a reactive fetal heart rate tracing throughout her admission with no evidence of contractions on tocometer.  Cervical check revealed a closed thick cervix.  She has not history of preterm labor.  Her symptom constellation was attributed to round ligament pain as well as her known sciatica worsening in the third trimester.   Blood pressure 127/70, pulse 82, temperature 98.1 F (36.7 C), temperature source Oral, resp. rate (!) 22, height 5\' 2"  (1.575 m), weight 71.7 kg, SpO2 99 %.  Consults: None  Significant Findings/ Diagnostic Studies:  Results for orders placed or performed during the hospital encounter of 05/26/20 (from the past 24 hour(s))  Urine Drug Screen, Qualitative ([facility] only)     Status: None   Collection Time: 05/26/20  6:08 PM  Result Value Ref Range   Tricyclic, Ur Screen NONE DETECTED NONE DETECTED   Amphetamines, Ur Screen NONE DETECTED NONE DETECTED   MDMA (Ecstasy)Ur Screen NONE DETECTED NONE DETECTED   Cocaine Metabolite,Ur Mesic NONE DETECTED NONE  DETECTED   Opiate, Ur Screen NONE DETECTED NONE DETECTED   Phencyclidine (PCP) Ur S NONE DETECTED NONE DETECTED   Cannabinoid 50 Ng, Ur [place] NONE DETECTED NONE DETECTED   Barbiturates, Ur Screen NONE DETECTED NONE DETECTED   Benzodiazepine, Ur Scrn NONE DETECTED NONE DETECTED   Methadone Scn, Ur NONE DETECTED NONE DETECTED  Urinalysis, Routine w reflex microscopic Urine, Clean Catch     Status: Abnormal   Collection Time: 05/26/20  6:08 PM  Result Value Ref Range   Color, Urine AMBER (A) YELLOW   APPearance CLOUDY (A) CLEAR   Specific Gravity, Urine 1.026 1.005 - 1.030   pH 5.0 5.0 - 8.0   Glucose, UA NEGATIVE NEGATIVE mg/dL   Hgb urine dipstick NEGATIVE NEGATIVE   Bilirubin Urine NEGATIVE NEGATIVE   Ketones, ur 20 (A) NEGATIVE mg/dL   Protein, ur 30 (A) NEGATIVE mg/dL   Nitrite NEGATIVE NEGATIVE   Leukocytes,Ua SMALL (A) NEGATIVE   RBC / HPF 0-5 0 - 5 RBC/hpf   WBC, UA 6-10 0 - 5 WBC/hpf   Bacteria, UA NONE SEEN NONE SEEN   Squamous Epithelial / LPF >50 (H) 0 - 5   Mucus PRESENT   Lipase, blood     Status: None   Collection Time: 05/26/20  6:13 PM  Result Value Ref Range   Lipase 28 11 - 51 U/L  CBC on admission     Status: Abnormal   Collection Time: 05/26/20  6:13 PM  Result Value Ref Range   WBC 8.2 4.0 - 10.5 K/uL  RBC 3.76 (L) 3.87 - 5.11 MIL/uL   Hemoglobin 9.0 (L) 12.0 - 15.0 g/dL   HCT 60.7 (L) 37.1 - 06.2 %   MCV 77.4 (L) 80.0 - 100.0 fL   MCH 23.9 (L) 26.0 - 34.0 pg   MCHC 30.9 30.0 - 36.0 g/dL   RDW 69.4 85.4 - 62.7 %   Platelets 274 150 - 400 K/uL   nRBC 0.0 0.0 - 0.2 %  Comprehensive metabolic panel     Status: Abnormal   Collection Time: 05/26/20  6:13 PM  Result Value Ref Range   Sodium 136 135 - 145 mmol/L   Potassium 3.4 (L) 3.5 - 5.1 mmol/L   Chloride 106 98 - 111 mmol/L   CO2 23 22 - 32 mmol/L   Glucose, Bld 80 70 - 99 mg/dL   BUN 8 6 - 20 mg/dL   Creatinine, Ser 0.35 0.44 - 1.00 mg/dL   Calcium 8.3 (L) 8.9 - 10.3 mg/dL   Total Protein 6.5 6.5  - 8.1 g/dL   Albumin 2.8 (L) 3.5 - 5.0 g/dL   AST 18 15 - 41 U/L   ALT 13 0 - 44 U/L   Alkaline Phosphatase 147 (H) 38 - 126 U/L   Total Bilirubin 0.6 0.3 - 1.2 mg/dL   GFR, Estimated >00 >93 mL/min   Anion gap 7 5 - 15     Procedures:  Baseline: 140 Variability: moderate Accelerations: present Decelerations: absent Tocometry: none The patient was monitored for 30 minutes, fetal heart rate tracing was deemed reactive, category I tracing,   Discharge Condition: good  Disposition: Discharge disposition: 01-Home or Self Care       Diet: Regular diet  Discharge Activity: Activity as tolerated  Discharge Instructions    Discharge activity:  No Restrictions   Complete by: As directed    Discharge diet:  No restrictions   Complete by: As directed    No sexual activity restrictions   Complete by: As directed    Notify physician for a general feeling that "something is not right"   Complete by: As directed    Notify physician for increase or change in vaginal discharge   Complete by: As directed    Notify physician for intestinal cramps, with or without diarrhea, sometimes described as "gas pain"   Complete by: As directed    Notify physician for leaking of fluid   Complete by: As directed    Notify physician for low, dull backache, unrelieved by heat or Tylenol   Complete by: As directed    Notify physician for menstrual like cramps   Complete by: As directed    Notify physician for pelvic pressure   Complete by: As directed    Notify physician for uterine contractions.  These may be painless and feel like the uterus is tightening or the baby is  "balling up"   Complete by: As directed    Notify physician for vaginal bleeding   Complete by: As directed    PRETERM LABOR:  Includes any of the follwing symptoms that occur between 20 - [redacted] weeks gestation.  If these symptoms are not stopped, preterm labor can result in preterm delivery, placing your baby at risk    Complete by: As directed      Allergies as of 05/26/2020      Reactions   Tessalon [benzonatate] Anaphylaxis   Aspirin    "makes me act funny"   Benadryl [diphenhydramine]    hyperactivity   Claritin [loratadine]    "  makes me act funny"   Gabapentin Other (See Comments)   Seizure activity   Naproxen    Pregabalin Other (See Comments)   Pt states Lyrica makes her extremely sleepy   Tramadol       Medication List    STOP taking these medications   ALPRAZolam 1 MG tablet Commonly known as: XANAX   cephALEXin 500 MG capsule Commonly known as: KEFLEX   docusate sodium 100 MG capsule Commonly known as: Colace   ondansetron 4 MG disintegrating tablet Commonly known as: Zofran ODT   oxyCODONE-acetaminophen 5-325 MG tablet Commonly known as: Roxicet   polyethylene glycol powder 17 GM/SCOOP powder Commonly known as: MiraLax   tiZANidine 4 MG tablet Commonly known as: ZANAFLEX   Xulane 150-35 MCG/24HR transdermal patch Generic drug: norelgestromin-ethinyl estradiol     TAKE these medications   albuterol 108 (90 Base) MCG/ACT inhaler Commonly known as: VENTOLIN HFA Inhale 2 puffs into the lungs every 4 (four) hours as needed for wheezing or shortness of breath.   multivitamin-prenatal 27-0.8 MG Tabs tablet Take 1 tablet by mouth daily at 12 noon.        Total time spent taking care of this patient: 40 minutes  Signed: Vena Austria 05/26/2020, 7:19 PM

## 2020-05-26 NOTE — OB Triage Note (Signed)
Pt presents via EMS to L&D c/o intermittent lower abdominal and lower back pain.  Pt describes pain as stabbing. Pt denies burning or hurting when urinating. Pt States her last intercourse was yesterday. Pt denies bleeding or LOF. Pt reports minimal fetal movement. VSS. Will continue to monitor.

## 2020-06-01 DIAGNOSIS — O99013 Anemia complicating pregnancy, third trimester: Secondary | ICD-10-CM | POA: Diagnosis not present

## 2020-06-19 DIAGNOSIS — O99013 Anemia complicating pregnancy, third trimester: Secondary | ICD-10-CM | POA: Diagnosis not present

## 2020-06-19 DIAGNOSIS — O280 Abnormal hematological finding on antenatal screening of mother: Secondary | ICD-10-CM | POA: Diagnosis not present

## 2020-06-19 DIAGNOSIS — D509 Iron deficiency anemia, unspecified: Secondary | ICD-10-CM | POA: Diagnosis not present

## 2020-06-19 DIAGNOSIS — Z3A00 Weeks of gestation of pregnancy not specified: Secondary | ICD-10-CM | POA: Diagnosis not present

## 2020-07-02 DIAGNOSIS — O99350 Diseases of the nervous system complicating pregnancy, unspecified trimester: Secondary | ICD-10-CM | POA: Diagnosis not present

## 2020-07-02 DIAGNOSIS — G40909 Epilepsy, unspecified, not intractable, without status epilepticus: Secondary | ICD-10-CM | POA: Diagnosis not present

## 2020-07-27 DIAGNOSIS — O9882 Other maternal infectious and parasitic diseases complicating childbirth: Secondary | ICD-10-CM | POA: Diagnosis not present

## 2020-07-27 DIAGNOSIS — Z3A39 39 weeks gestation of pregnancy: Secondary | ICD-10-CM | POA: Diagnosis not present

## 2020-07-27 DIAGNOSIS — Z87891 Personal history of nicotine dependence: Secondary | ICD-10-CM | POA: Diagnosis not present

## 2020-07-27 DIAGNOSIS — O9902 Anemia complicating childbirth: Secondary | ICD-10-CM | POA: Diagnosis not present

## 2020-07-27 DIAGNOSIS — G8929 Other chronic pain: Secondary | ICD-10-CM | POA: Diagnosis not present

## 2020-07-27 DIAGNOSIS — M542 Cervicalgia: Secondary | ICD-10-CM | POA: Diagnosis not present

## 2020-07-27 DIAGNOSIS — M5136 Other intervertebral disc degeneration, lumbar region: Secondary | ICD-10-CM | POA: Diagnosis not present

## 2020-07-27 DIAGNOSIS — O411230 Chorioamnionitis, third trimester, not applicable or unspecified: Secondary | ICD-10-CM | POA: Diagnosis not present

## 2020-07-27 DIAGNOSIS — Z20822 Contact with and (suspected) exposure to covid-19: Secondary | ICD-10-CM | POA: Diagnosis not present

## 2020-07-27 DIAGNOSIS — O99354 Diseases of the nervous system complicating childbirth: Secondary | ICD-10-CM | POA: Diagnosis not present

## 2020-07-27 DIAGNOSIS — Z885 Allergy status to narcotic agent status: Secondary | ICD-10-CM | POA: Diagnosis not present

## 2020-07-27 DIAGNOSIS — Z886 Allergy status to analgesic agent status: Secondary | ICD-10-CM | POA: Diagnosis not present

## 2020-07-27 DIAGNOSIS — Z888 Allergy status to other drugs, medicaments and biological substances status: Secondary | ICD-10-CM | POA: Diagnosis not present

## 2020-07-27 DIAGNOSIS — O9952 Diseases of the respiratory system complicating childbirth: Secondary | ICD-10-CM | POA: Diagnosis not present

## 2020-07-27 DIAGNOSIS — M5432 Sciatica, left side: Secondary | ICD-10-CM | POA: Diagnosis not present

## 2020-07-27 DIAGNOSIS — Z206 Contact with and (suspected) exposure to human immunodeficiency virus [HIV]: Secondary | ICD-10-CM | POA: Diagnosis not present

## 2020-07-27 DIAGNOSIS — A749 Chlamydial infection, unspecified: Secondary | ICD-10-CM | POA: Diagnosis not present

## 2020-07-27 DIAGNOSIS — D509 Iron deficiency anemia, unspecified: Secondary | ICD-10-CM | POA: Diagnosis not present

## 2020-07-27 DIAGNOSIS — J4520 Mild intermittent asthma, uncomplicated: Secondary | ICD-10-CM | POA: Diagnosis not present

## 2020-07-27 DIAGNOSIS — Z8701 Personal history of pneumonia (recurrent): Secondary | ICD-10-CM | POA: Diagnosis not present

## 2020-07-27 DIAGNOSIS — Z87442 Personal history of urinary calculi: Secondary | ICD-10-CM | POA: Diagnosis not present

## 2020-07-28 DIAGNOSIS — O99353 Diseases of the nervous system complicating pregnancy, third trimester: Secondary | ICD-10-CM | POA: Diagnosis not present

## 2020-07-28 DIAGNOSIS — Z3A39 39 weeks gestation of pregnancy: Secondary | ICD-10-CM | POA: Diagnosis not present

## 2020-07-28 DIAGNOSIS — Z302 Encounter for sterilization: Secondary | ICD-10-CM | POA: Diagnosis not present

## 2020-07-28 DIAGNOSIS — G40311 Generalized idiopathic epilepsy and epileptic syndromes, intractable, with status epilepticus: Secondary | ICD-10-CM | POA: Diagnosis not present

## 2020-07-28 DIAGNOSIS — O411230 Chorioamnionitis, third trimester, not applicable or unspecified: Secondary | ICD-10-CM | POA: Diagnosis not present

## 2020-08-04 ENCOUNTER — Observation Stay: Payer: Managed Care, Other (non HMO)

## 2020-08-04 ENCOUNTER — Observation Stay
Admission: EM | Admit: 2020-08-04 | Discharge: 2020-08-05 | Disposition: A | Discharge status: Home / Self Care | Payer: Managed Care, Other (non HMO) | Attending: Internal Medicine | Admitting: Internal Medicine

## 2020-08-04 ENCOUNTER — Encounter: Payer: Self-pay | Admitting: Internal Medicine

## 2020-08-04 ENCOUNTER — Emergency Department: Payer: Managed Care, Other (non HMO)

## 2020-08-04 DIAGNOSIS — Z87891 Personal history of nicotine dependence: Secondary | ICD-10-CM | POA: Diagnosis not present

## 2020-08-04 DIAGNOSIS — Z79899 Other long term (current) drug therapy: Secondary | ICD-10-CM | POA: Diagnosis not present

## 2020-08-04 DIAGNOSIS — G40909 Epilepsy, unspecified, not intractable, without status epilepticus: Secondary | ICD-10-CM | POA: Diagnosis not present

## 2020-08-04 DIAGNOSIS — R102 Pelvic and perineal pain: Secondary | ICD-10-CM | POA: Insufficient documentation

## 2020-08-04 DIAGNOSIS — R569 Unspecified convulsions: Secondary | ICD-10-CM

## 2020-08-04 DIAGNOSIS — Z206 Contact with and (suspected) exposure to human immunodeficiency virus [HIV]: Secondary | ICD-10-CM | POA: Insufficient documentation

## 2020-08-04 DIAGNOSIS — O99893 Other specified diseases and conditions complicating puerperium: Secondary | ICD-10-CM | POA: Diagnosis not present

## 2020-08-04 DIAGNOSIS — G40901 Epilepsy, unspecified, not intractable, with status epilepticus: Secondary | ICD-10-CM

## 2020-08-04 DIAGNOSIS — J45909 Unspecified asthma, uncomplicated: Secondary | ICD-10-CM | POA: Insufficient documentation

## 2020-08-04 DIAGNOSIS — Z20822 Contact with and (suspected) exposure to covid-19: Secondary | ICD-10-CM | POA: Insufficient documentation

## 2020-08-04 DIAGNOSIS — F419 Anxiety disorder, unspecified: Secondary | ICD-10-CM

## 2020-08-04 DIAGNOSIS — Z8619 Personal history of other infectious and parasitic diseases: Secondary | ICD-10-CM

## 2020-08-04 LAB — CBC
HCT: 33.8 % — ABNORMAL LOW (ref 36.0–46.0)
Hemoglobin: 10.9 g/dL — ABNORMAL LOW (ref 12.0–15.0)
MCH: 25.9 pg — ABNORMAL LOW (ref 26.0–34.0)
MCHC: 32.2 g/dL (ref 30.0–36.0)
MCV: 80.3 fL (ref 80.0–100.0)
Platelets: 402 10*3/uL — ABNORMAL HIGH (ref 150–400)
RBC: 4.21 MIL/uL (ref 3.87–5.11)
RDW: 22.3 % — ABNORMAL HIGH (ref 11.5–15.5)
WBC: 10.7 10*3/uL — ABNORMAL HIGH (ref 4.0–10.5)
nRBC: 0 % (ref 0.0–0.2)

## 2020-08-04 LAB — RESP PANEL BY RT-PCR (FLU A&B, COVID) ARPGX2
Influenza A by PCR: NEGATIVE
Influenza B by PCR: NEGATIVE
SARS Coronavirus 2 by RT PCR: NEGATIVE

## 2020-08-04 LAB — COMPREHENSIVE METABOLIC PANEL
ALT: 18 U/L (ref 0–44)
AST: 31 U/L (ref 15–41)
Albumin: 3.4 g/dL — ABNORMAL LOW (ref 3.5–5.0)
Alkaline Phosphatase: 125 U/L (ref 38–126)
Anion gap: 11 (ref 5–15)
BUN: 11 mg/dL (ref 6–20)
CO2: 24 mmol/L (ref 22–32)
Calcium: 9.2 mg/dL (ref 8.9–10.3)
Chloride: 104 mmol/L (ref 98–111)
Creatinine, Ser: 0.65 mg/dL (ref 0.44–1.00)
GFR, Estimated: >60 mL/min (ref >60)
Glucose, Bld: 96 mg/dL (ref 70–99)
Potassium: 3.5 mmol/L (ref 3.5–5.1)
Sodium: 139 mmol/L (ref 135–145)
Total Bilirubin: 0.6 mg/dL (ref 0.3–1.2)
Total Protein: 7.1 g/dL (ref 6.5–8.1)

## 2020-08-04 LAB — CBG MONITORING, ED: Glucose-Capillary: 89 mg/dL (ref 70–99)

## 2020-08-04 LAB — VALPROIC ACID LEVEL: Valproic Acid Lvl: <10 ug/mL — ABNORMAL LOW (ref 50.0–100.0)

## 2020-08-04 LAB — HCG, QUANTITATIVE, PREGNANCY: hCG, Beta Chain, Quant, S: 49 m[IU]/mL — ABNORMAL HIGH (ref <5)

## 2020-08-04 MED ORDER — ACETAMINOPHEN 325 MG PO TABS: 650.0000 mg | ORAL_TABLET | ORAL | Status: DC | PRN

## 2020-08-04 MED ORDER — LORAZEPAM 2 MG/ML IJ SOLN
INTRAMUSCULAR | Status: AC
  Administered 2020-08-04: 1 mg via INTRAVENOUS
  Filled 2020-08-04: qty 1

## 2020-08-04 MED ORDER — LORAZEPAM 2 MG/ML IJ SOLN: 1.0000 mg | INTRAMUSCULAR | Status: DC | PRN

## 2020-08-04 MED ORDER — PERAMPANEL 2 MG PO TABS: 1.0000 mg | ORAL_TABLET | Freq: Two times a day (BID) | ORAL | Status: DC

## 2020-08-04 MED ORDER — ALBUTEROL SULFATE (2.5 MG/3ML) 0.083% IN NEBU: 2.5000 mg | INHALATION_SOLUTION | RESPIRATORY_TRACT | Status: DC | PRN

## 2020-08-04 MED ORDER — PRENATAL 27-0.8 MG PO TABS
1.0000 | ORAL_TABLET | Freq: Every day | ORAL | Status: DC
  Filled 2020-08-04: qty 1

## 2020-08-04 MED ORDER — LORAZEPAM 2 MG/ML IJ SOLN: 2.0000 mg | Freq: Once | INTRAMUSCULAR | Status: DC | PRN

## 2020-08-04 MED ORDER — VALPROATE SODIUM 500 MG/5ML IV SOLN
900.0000 mg | Freq: Once | INTRAVENOUS | Status: AC
  Administered 2020-08-04: 900 mg via INTRAVENOUS
  Filled 2020-08-04: qty 9

## 2020-08-04 MED ORDER — ONDANSETRON HCL 4 MG PO TABS: 4.0000 mg | ORAL_TABLET | Freq: Four times a day (QID) | ORAL | Status: DC | PRN

## 2020-08-04 MED ORDER — PERAMPANEL 2 MG PO TABS
4.0000 mg | ORAL_TABLET | Freq: Two times a day (BID) | ORAL | Status: DC
  Administered 2020-08-05: 4 mg via ORAL
  Filled 2020-08-04 (×4): qty 2

## 2020-08-04 MED ORDER — LEVETIRACETAM IN NACL 1000 MG/100ML IV SOLN
1000.0000 mg | Freq: Two times a day (BID) | INTRAVENOUS | Status: DC
  Filled 2020-08-04 (×2): qty 100

## 2020-08-04 MED ORDER — PERAMPANEL 2 MG PO TABS: 4.0000 mg | ORAL_TABLET | Freq: Two times a day (BID) | ORAL | Status: DC

## 2020-08-04 MED ORDER — ALBUTEROL SULFATE HFA 108 (90 BASE) MCG/ACT IN AERS
2.0000 | INHALATION_SPRAY | RESPIRATORY_TRACT | Status: DC | PRN
Start: 2020-08-04 — End: 2020-08-04

## 2020-08-04 MED ORDER — LEVETIRACETAM IN NACL 1000 MG/100ML IV SOLN: 1000.0000 mg | Freq: Two times a day (BID) | INTRAVENOUS | Status: DC

## 2020-08-04 MED ORDER — ACETAMINOPHEN 650 MG RE SUPP: 650.0000 mg | RECTAL | Status: DC | PRN

## 2020-08-04 MED ORDER — LORAZEPAM 2 MG/ML IJ SOLN
1.0000 mg | Freq: Once | INTRAMUSCULAR | Status: AC
  Administered 2020-08-04: 1 mg via INTRAVENOUS

## 2020-08-04 MED ORDER — ONDANSETRON HCL 4 MG/2ML IJ SOLN: 4.0000 mg | Freq: Four times a day (QID) | INTRAMUSCULAR | Status: DC | PRN

## 2020-08-04 MED ORDER — LEVETIRACETAM IN NACL 500 MG/100ML IV SOLN
500.0000 mg | Freq: Once | INTRAVENOUS | Status: AC
  Administered 2020-08-04: 500 mg via INTRAVENOUS
  Filled 2020-08-04: qty 100

## 2020-08-04 MED ORDER — LORAZEPAM 2 MG/ML IJ SOLN
2.0000 mg | INTRAMUSCULAR | Status: DC | PRN
Start: 2020-08-04 — End: 2020-08-04

## 2020-08-04 MED ORDER — LEVETIRACETAM IN NACL 1500 MG/100ML IV SOLN
1500.0000 mg | Freq: Once | INTRAVENOUS | Status: AC
  Administered 2020-08-04: 1500 mg via INTRAVENOUS
  Filled 2020-08-04: qty 100

## 2020-08-04 MED ORDER — LORAZEPAM 2 MG/ML IJ SOLN
INTRAMUSCULAR | Status: AC
  Administered 2020-08-04: 0.5 mg via INTRAVENOUS
  Filled 2020-08-04: qty 2

## 2020-08-04 MED ORDER — SODIUM CHLORIDE 0.9 % IV SOLN
75.0000 mL/h | INTRAVENOUS | Status: DC
  Administered 2020-08-04: 75 mL/h via INTRAVENOUS

## 2020-08-04 NOTE — ED Notes (Addendum)
EDT at bedside for pt safety.

## 2020-08-04 NOTE — ED Notes (Signed)
X-ray tech unable to get x-ray at this time
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
## 2020-08-04 NOTE — ED Notes (Addendum)
Pt continues to attempt to get out of bed in CT. CT tech and RN attempting multiple times to redirect pt verbally. PRN medication administered with no change in pt behavior. Pt remains uncooperative at this time. MD aware.

## 2020-08-04 NOTE — ED Notes (Signed)
Patient transported to CT 

## 2020-08-04 NOTE — ED Triage Notes (Signed)
Pt to ED via [facility] from home. Per EMS pt had unwitnessed seizure this morning and had another unwitnessed seizure at 2pm. Pt post ictal and found on ground upon EMS arrival. Pt alert but disoriented x4. Pt currently on Depakote and family states pt did take medication this morning. Pt with recent c-section. VSS. CBG 106.

## 2020-08-04 NOTE — ED Notes (Signed)
Pt back from CT

## 2020-08-04 NOTE — ED Notes (Signed)
Pt transported to CT ?

## 2020-08-04 NOTE — ED Notes (Addendum)
This note entered in error

## 2020-08-04 NOTE — ED Notes (Addendum)
CT called to inform RN pt is uncooperative for CT scan. MD given RN ok to administer ativan in 0.5mg  increments.

## 2020-08-04 NOTE — ED Notes (Addendum)
This RN updated pt's mother on POC. Pt mother stated after pt's first seizure pt took ibuprofen and did not want to come in. Mother stating pt's newborn is with family friend named Consuella Lose.

## 2020-08-04 NOTE — ED Notes (Signed)
Pt resting in stretcher with eyes closed at this time.  No needs identified at this time.  No distress noted.  Even chest rise and fall noted.   
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
## 2020-08-05 DIAGNOSIS — G40909 Epilepsy, unspecified, not intractable, without status epilepticus: Secondary | ICD-10-CM | POA: Diagnosis not present

## 2020-08-05 LAB — PROCALCITONIN
Procalcitonin: <0.1 ng/mL
Procalcitonin: <0.1 ng/mL

## 2020-08-05 LAB — HIV ANTIBODY (ROUTINE TESTING W REFLEX): HIV Screen 4th Generation wRfx: NONREACTIVE

## 2020-08-05 MED ORDER — LEVETIRACETAM 1000 MG PO TABS: 1000.0000 mg | ORAL_TABLET | Freq: Two times a day (BID) | ORAL | 0 refills | Status: DC

## 2020-08-05 NOTE — ED Notes (Signed)
Pt asking about how she got here. Pt able to follow commands and have conversation but is unsure about what happened for pt to get here. Pt informed and nodded in understanding. Asked if she was going to have to stay here tonight and pt was oriented that it was morning and she was being admitted.

## 2020-08-05 NOTE — ED Notes (Signed)
Pt resting in stretcher with eyes closed at this time.  New IV line started in right arm d/t pain and issues with other IV.  Pt denies need to use restroom at this time and continues to lie in bed with eyes shut.  No other needs identified at this time.

## 2020-08-05 NOTE — Progress Notes (Signed)
CSW spoke with RN who reports that patient needs ride home. CSW attempted to arrange [facility], was on hold with no answer.   CSW has completed Oncologist for taxi cab transport home, printed to floor for [employer].   No other discharge needs identified at this time.   [place], [place] [phone]

## 2020-08-05 NOTE — Progress Notes (Signed)
Patient refused to complete admission navigators

## 2020-08-05 NOTE — ED Notes (Signed)
Pt assisted to use toilet at this time.  Pt very wobbly on feet with ambulation to restroom.  Pt assisted back to bed.  Pt noted to have BM.

## 2020-08-05 NOTE — ED Notes (Signed)
Pt resting in NAD in bed.

## 2020-08-05 NOTE — Discharge Summary (Signed)
Hannah Carlson:[id number] DOB: 04/18/1980 DOA: 08/04/2020  PCP: [facility]  Admit date: 08/04/2020 Discharge date: 08/05/2020  Admitted From: Home Disposition: Home  Recommendations for Outpatient Follow-up:  Follow up with PCP in 1 week Please obtain BMP/CBC in one week Please follow up with neurology in one week     Discharge Condition:Stable CODE STATUS: Full Diet recommendation: Regular    Brief/Interim Summary: Per STM:HDQQI Hannah Carlson is a 40 y.o. 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  Per family, patient last took her Fycompa dose evening of 08/03/2020.  Patient's home Fycompa dose is 4 mg twice daily.  She has missed a few doses as she admitted.  At bedside on admission patient was postictal, occasionally attempting to get out of bed, and redirectable.  She is maintained her airway.     In the emergency department patient was given Ativan 1 mg IV, Keppra 1500 mg IV loading dose, and valproic injection 900 mg once.   Neurology, Dr. Amada Jupiter was consulted by ED provider  This a.m. patient was seen and examined.  She was sleeping in bed with her partner.  She denied any complaints when asking although kept her eyes closed.  Neurology saw patient and recommended to give her fycompa dose this am and discharge home. They did recommend pt to get keppra this am but patient refused to take keppra. Neurology recommended discharge with two days of keppra 1gm bid while fycompa becomes therapeutic. Neurology cleared patient for discharge.  Discharge Diagnoses:  Principal Problem:   Seizure disorder [facility]) Active Problems:   Seizure (HCC)   History of chlamydia infection   Anxiety    Discharge Instructions  Discharge  Instructions     Call MD for:  persistant dizziness or light-headedness   Complete by: As directed    Diet - low sodium heart healthy   Complete by: As directed    Discharge instructions   Complete by: As directed    Dont miss your meds. Follow up with your neurologist/pcp   Increase activity slowly   Complete by: As directed       Allergies as of 08/05/2020       Reactions   Tessalon [benzonatate] Anaphylaxis   Aspirin    "makes me act funny"   Benadryl [diphenhydramine]    hyperactivity   Claritin [loratadine]    "makes me act funny"   Gabapentin Other (See Comments)   Seizure activity   Naproxen    Pregabalin Other (See Comments)   Pt states Lyrica makes her extremely sleepy   Tramadol         Medication List     STOP taking these medications    acetaminophen 325 MG tablet Commonly known as: TYLENOL   ibuprofen 800 MG tablet Commonly known as: ADVIL   Oxycodone HCl 10 MG Tabs       TAKE these medications    albuterol 108 (90 Base) MCG/ACT inhaler Commonly known as: VENTOLIN HFA Inhale 2 puffs into the lungs every 4 (four) hours as needed for wheezing or shortness of breath.   cyanocobalamin 1000 MCG tablet Take 1,000 mcg by mouth daily.   docusate sodium 100 MG capsule Commonly known as: COLACE Take 100 mg by mouth 2 (two) times daily.   Fycompa 2 MG tablet Generic drug: perampanel  Take 4 mg by mouth 2 (two) times daily.   levETIRAcetam 1000 MG tablet Commonly known as: Keppra Take 1 tablet (1,000 mg total) by mouth 2 (two) times daily for 2 days.   lidocaine 5 % Commonly known as: LIDODERM Place 1 patch onto the skin as needed for pain.   multivitamin-prenatal 27-0.8 MG Tabs tablet Take 1 tablet by mouth daily at 12 noon.   simethicone 80 MG chewable tablet Commonly known as: MYLICON Chew 80 mg by mouth in the morning, at noon, in the evening, and at bedtime.        Allergies  Allergen Reactions   Tessalon [Benzonatate]  Anaphylaxis   Aspirin     "makes me act funny"   Benadryl [Diphenhydramine]     hyperactivity   Claritin [Loratadine]     "makes me act funny"    Gabapentin Other (See Comments)    Seizure activity   Naproxen    Pregabalin Other (See Comments)    Pt states Lyrica makes her extremely sleepy   Tramadol     Consultations: neurology   Procedures/Studies: CT Head Wo Contrast  Result Date: 08/04/2020 CLINICAL DATA:  Seizure. EXAM: CT HEAD WITHOUT CONTRAST TECHNIQUE: Contiguous axial images were obtained from the base of the skull through the vertex without intravenous contrast. COMPARISON:  January 03, 2016. FINDINGS: Brain: No evidence of acute infarction, hemorrhage, hydrocephalus, extra-axial collection or mass lesion/mass effect. Vascular: No hyperdense vessel or unexpected calcification. Skull: Normal. Negative for fracture or focal lesion. Sinuses/Orbits: No acute finding. Other: None. IMPRESSION: No acute intracranial abnormality seen. Electronically Signed   By: Lupita Raider M.D.   On: 08/04/2020 18:51   DG Chest Port 1 View  Result Date: 08/04/2020 CLINICAL DATA:  Unwitnessed seizure. EXAM: PORTABLE CHEST 1 VIEW COMPARISON:  January 09, 2019. FINDINGS: The heart size and mediastinal contours are within normal limits. Both lungs are clear. The visualized skeletal structures are unremarkable. IMPRESSION: No active disease. Electronically Signed   By: Lupita Raider M.D.   On: 08/04/2020 21:35      Subjective: No overnight issues  Discharge Exam: Vitals:   08/05/20 0856 08/05/20 1153  BP: (!) 125/94 128/79  Pulse: 84 80  Resp: 18 18  Temp: 98.2 F (36.8 C) 98.9 F (37.2 C)  SpO2: 100% 99%   Vitals:   08/05/20 0745 08/05/20 0800 08/05/20 0856 08/05/20 1153  BP:   (!) 125/94 128/79  Pulse: 87  84 80  Resp:   18 18  Temp:  98.7 F (37.1 C) 98.2 F (36.8 C) 98.9 F (37.2 C)  TempSrc:      SpO2: 96%  100% 99%  Weight:      Height:        General: not in  acute distress Cardiovascular: RRR, S1/S2 +, no rubs, no gallops Respiratory: CTA bilaterally, no wheezing, no rhonchi Abdominal: Soft, NT, ND, bowel sounds + Extremities: no edema    The results of significant diagnostics from this hospitalization (including imaging, microbiology, ancillary and laboratory) are listed below for reference.     Microbiology: Recent Results (from the past 240 hour(s))  Resp Panel by RT-PCR (Flu A&B, Covid) Nasopharyngeal Swab     Status: None   Collection Time: 08/04/20  7:43 PM   Specimen: Nasopharyngeal Swab; Nasopharyngeal(NP) swabs in vial transport medium  Result Value Ref Range Status   SARS Coronavirus 2 by RT PCR NEGATIVE NEGATIVE Final    Comment: (NOTE) SARS-CoV-2 target nucleic acids are NOT  DETECTED.  The SARS-CoV-2 RNA is generally detectable in upper respiratory specimens during the acute phase of infection. The lowest concentration of SARS-CoV-2 viral copies this assay can detect is 138 copies/mL. A negative result does not preclude SARS-Cov-2 infection and should not be used as the sole basis for treatment or other patient management decisions. A negative result may occur with  improper specimen collection/handling, submission of specimen other than nasopharyngeal swab, presence of viral mutation(s) within the areas targeted by this assay, and inadequate number of viral copies(<138 copies/mL). A negative result must be combined with clinical observations, patient history, and epidemiological information. The expected result is Negative.  Fact Sheet for Patients:  BloggerCourse.com  Fact Sheet for Healthcare Providers:  SeriousBroker.it  This test is no t yet approved or cleared by the [place] FDA and  has been authorized for detection and/or diagnosis of SARS-CoV-2 by FDA under an Emergency Use Authorization (EUA). This EUA will remain  in effect (meaning this test can be  used) for the duration of the COVID-19 declaration under Section 564(b)(1) of the Act, 21 U.S.C.section 360bbb-3(b)(1), unless the authorization is terminated  or revoked sooner.       Influenza A by PCR NEGATIVE NEGATIVE Final   Influenza B by PCR NEGATIVE NEGATIVE Final    Comment: (NOTE) The Xpert Xpress SARS-CoV-2/FLU/RSV plus assay is intended as an aid in the diagnosis of influenza from Nasopharyngeal swab specimens and should not be used as a sole basis for treatment. Nasal washings and aspirates are unacceptable for Xpert Xpress SARS-CoV-2/FLU/RSV testing.  Fact Sheet for Patients: BloggerCourse.com  Fact Sheet for Healthcare Providers: SeriousBroker.it  This test is not yet approved or cleared by the [place] FDA and has been authorized for detection and/or diagnosis of SARS-CoV-2 by FDA under an Emergency Use Authorization (EUA). This EUA will remain in effect (meaning this test can be used) for the duration of the COVID-19 declaration under Section 564(b)(1) of the Act, 21 U.S.C. section 360bbb-3(b)(1), unless the authorization is terminated or revoked.  Performed at [facility], [address] [place], [place] [address]      Labs: BNP (last 3 results) No results for input(s): BNP in the last 8760 hours. Basic Metabolic Panel: Recent Labs  Lab 08/04/20 1450  NA 139  K 3.5  CL 104  CO2 24  GLUCOSE 96  BUN 11  CREATININE 0.65  CALCIUM 9.2   Liver Function Tests: Recent Labs  Lab 08/04/20 1450  AST 31  ALT 18  ALKPHOS 125  BILITOT 0.6  PROT 7.1  ALBUMIN 3.4*   No results for input(s): LIPASE, AMYLASE in the last 168 hours. No results for input(s): AMMONIA in the last 168 hours. CBC: Recent Labs  Lab 08/04/20 1450  WBC 10.7*  HGB 10.9*  HCT 33.8*  MCV 80.3  PLT 402*   Cardiac Enzymes: No results for input(s): CKTOTAL, CKMB, CKMBINDEX, TROPONINI in the last 168  hours. BNP: Invalid input(s): POCBNP CBG: Recent Labs  Lab 08/04/20 1503  GLUCAP 89   D-Dimer No results for input(s): DDIMER in the last 72 hours. Hgb A1c No results for input(s): HGBA1C in the last 72 hours. Lipid Profile No results for input(s): CHOL, HDL, LDLCALC, TRIG, CHOLHDL, LDLDIRECT in the last 72 hours. Thyroid function studies No results for input(s): TSH, T4TOTAL, T3FREE, THYROIDAB in the last 72 hours.  Invalid input(s): FREET3 Anemia work up No results for input(s): VITAMINB12, FOLATE, FERRITIN, TIBC, IRON, RETICCTPCT in the last 72 hours.  Urinalysis    Component Value Date/Time   COLORURINE AMBER (A) 05/26/2020 1808   APPEARANCEUR CLOUDY (A) 05/26/2020 1808   LABSPEC 1.026 05/26/2020 1808   PHURINE 5.0 05/26/2020 1808   GLUCOSEU NEGATIVE 05/26/2020 1808   HGBUR NEGATIVE 05/26/2020 1808   BILIRUBINUR NEGATIVE 05/26/2020 1808   KETONESUR 20 (A) 05/26/2020 1808   PROTEINUR 30 (A) 05/26/2020 1808   UROBILINOGEN 1.0 04/17/2012 0933   NITRITE NEGATIVE 05/26/2020 1808   LEUKOCYTESUR SMALL (A) 05/26/2020 1808   Sepsis Labs Invalid input(s): PROCALCITONIN,  WBC,  LACTICIDVEN Microbiology Recent Results (from the past 240 hour(s))  Resp Panel by RT-PCR (Flu A&B, Covid) Nasopharyngeal Swab     Status: None   Collection Time: 08/04/20  7:43 PM   Specimen: Nasopharyngeal Swab; Nasopharyngeal(NP) swabs in vial transport medium  Result Value Ref Range Status   SARS Coronavirus 2 by RT PCR NEGATIVE NEGATIVE Final    Comment: (NOTE) SARS-CoV-2 target nucleic acids are NOT DETECTED.  The SARS-CoV-2 RNA is generally detectable in upper respiratory specimens during the acute phase of infection. The lowest concentration of SARS-CoV-2 viral copies this assay can detect is 138 copies/mL. A negative result does not preclude SARS-Cov-2 infection and should not be used as the sole basis for treatment or other patient management decisions. A negative result may occur with   improper specimen collection/handling, submission of specimen other than nasopharyngeal swab, presence of viral mutation(s) within the areas targeted by this assay, and inadequate number of viral copies(<138 copies/mL). A negative result must be combined with clinical observations, patient history, and epidemiological information. The expected result is Negative.  Fact Sheet for Patients:  BloggerCourse.comhttps://www.fda.gov/media/152166/download  Fact Sheet for Healthcare Providers:  SeriousBroker.ithttps://www.fda.gov/media/152162/download  This test is no t yet approved or cleared by the [place] FDA and  has been authorized for detection and/or diagnosis of SARS-CoV-2 by FDA under an Emergency Use Authorization (EUA). This EUA will remain  in effect (meaning this test can be used) for the duration of the COVID-19 declaration under Section 564(b)(1) of the Act, 21 U.S.C.section 360bbb-3(b)(1), unless the authorization is terminated  or revoked sooner.       Influenza A by PCR NEGATIVE NEGATIVE Final   Influenza B by PCR NEGATIVE NEGATIVE Final    Comment: (NOTE) The Xpert Xpress SARS-CoV-2/FLU/RSV plus assay is intended as an aid in the diagnosis of influenza from Nasopharyngeal swab specimens and should not be used as a sole basis for treatment. Nasal washings and aspirates are unacceptable for Xpert Xpress SARS-CoV-2/FLU/RSV testing.  Fact Sheet for Patients: BloggerCourse.comhttps://www.fda.gov/media/152166/download  Fact Sheet for Healthcare Providers: SeriousBroker.ithttps://www.fda.gov/media/152162/download  This test is not yet approved or cleared by the [place] FDA and has been authorized for detection and/or diagnosis of SARS-CoV-2 by FDA under an Emergency Use Authorization (EUA). This EUA will remain in effect (meaning this test can be used) for the duration of the COVID-19 declaration under Section 564(b)(1) of the Act, 21 U.S.C. section 360bbb-3(b)(1), unless the authorization is terminated  or revoked.  Performed at [facility], [address] [place], [place] [address]      Time coordinating discharge: Over 30 minutes  SIGNED:   Lynn ItoSahar Marcelle Hepner, MD  [facility] 08/05/2020, 5:37 PM Pager   If 7PM-7AM, please contact night-coverage [email or web address] Password [email or web address]

## 2020-08-05 NOTE — Consult Note (Signed)
Neurology Consultation Reason for Consult: Seizures Referring Physician: Cox, a  CC: Seizures  History is obtained from: Patient  HPI: Hannah Carlson is a 40 y.o. female with a history of seizures since age 68 who presents with flurry of seizures.  She recently gave birth, and therefore has been having some sleep deprivation.  She also missed approximately three doses of her perampanel over the past few days.  On arrival, she had multiple seizures and therefore was loaded with IV Keppra, and there was some confusion about her home medications and therefore she was given Depakote as well.  She states that she has been on perampanel monotherapy for quite some time, even prior to her most recent pregnancy.  She takes 4 mg twice daily and had fairly good seizure control up until yesterday.  She has been maintained on Keppra in addition to perampanel, given that we do not have perampanel on formulary, though we should have some this afternoon.  ROS: A 14 point ROS was performed and is negative except as noted in the HPI.  Past Medical History:  Diagnosis Date   Anxiety    Asthma    Seizures (HCC)    Spinal stenosis      No family history on file.   Social History:  reports that she quit smoking about 14 years ago. Her smoking use included cigarettes. She has never used smokeless tobacco. She reports current alcohol use. She reports that she does not use drugs.   Exam: Current vital signs: BP (!) 125/94 (BP Location: Left Arm)   Pulse 84   Temp 98.2 F (36.8 C)   Resp 18   Ht 5\' 2"  (1.575 m)   Wt 72 kg   LMP  (LMP Unknown)   SpO2 100%   Breastfeeding Unknown Comment: recent pregnancy  BMI 29.03 kg/m  Vital signs in last 24 hours: Temp:  [98.1 F (36.7 C)-98.7 F (37.1 C)] 98.2 F (36.8 C) (07/20 0856) Pulse Rate:  [79-101] 84 (07/20 0856) Resp:  [16-23] 18 (07/20 0856) BP: (121-155)/(74-112) 125/94 (07/20 0856) SpO2:  [93 %-100 %] 100 % (07/20 0856) Weight:  [72 kg] 72 kg  (07/19 1458)   Physical Exam  Constitutional: Appears well-developed and well-nourished.  Psych: Affect appropriate to situation Eyes: No scleral injection HENT: No OP obstruction MSK: no joint deformities.  Cardiovascular: Normal rate and regular rhythm.  Respiratory: Effort normal, non-labored breathing GI: Soft.  No distension. There is no tenderness.  Skin: WDI  Neuro: Mental Status: Patient is awake, alert, oriented to person, place, and situation. Patient is able to give a clear and coherent history. No signs of aphasia or neglect Cranial Nerves: II: Visual Fields are full. Pupils are equal, round, and reactive to light.   III,IV, VI: EOMI without ptosis or diploplia.  V: Facial sensation is symmetric to temperature VII: Facial movement is symmetric.  VIII: hearing is intact to voice X: Uvula elevates symmetrically XI: Shoulder shrug is symmetric. XII: tongue is midline without atrophy or fasciculations.  Motor: Tone is normal. Bulk is normal. 5/5 strength was present in all four extremities.  Sensory: Sensation is symmetric to light touch and temperature in the arms and legs. Cerebellar: FNF and HKS are intact bilaterally    I have reviewed labs in epic and the results pertinent to this consultation are: CBC with mild anemia, CMP with mild hypokalemia  I have reviewed the images obtained: CT head-negative  Impression: 40 year old female with seizure flurry now returned essentially  to baseline.  I discussed with her her medications, and she thinks she missed about three doses of her medications over the past few days.  I discussed the possibility of increasing her perampanel, but given that she has missed doses, she favors continuing her previous dose.  Especially with her having missed additional doses while in the hospital, I favor covering temporarily with Keppra until she has time to come little closer to steady state.  Recommendations: 1) Keppra 1 g twice daily  for couple more days 2) Fycompa 4 mg twice daily, I encouraged her to get a pill planner and set an alarm on her phone to remind her to take her medicine consistently.    Ritta Slot, MD [facility] [phone]  If 7pm- 7am, please page neurology on call as listed in AMION.

## 2020-10-21 DIAGNOSIS — G40311 Generalized idiopathic epilepsy and epileptic syndromes, intractable, with status epilepticus: Secondary | ICD-10-CM | POA: Diagnosis not present

## 2021-03-23 DIAGNOSIS — M5412 Radiculopathy, cervical region: Secondary | ICD-10-CM | POA: Diagnosis not present

## 2021-03-23 DIAGNOSIS — Z981 Arthrodesis status: Secondary | ICD-10-CM | POA: Diagnosis not present

## 2021-04-08 DIAGNOSIS — M47812 Spondylosis without myelopathy or radiculopathy, cervical region: Secondary | ICD-10-CM | POA: Diagnosis not present

## 2021-04-08 DIAGNOSIS — M4312 Spondylolisthesis, cervical region: Secondary | ICD-10-CM | POA: Diagnosis not present

## 2021-04-08 DIAGNOSIS — M5412 Radiculopathy, cervical region: Secondary | ICD-10-CM | POA: Diagnosis not present

## 2021-04-08 DIAGNOSIS — M4802 Spinal stenosis, cervical region: Secondary | ICD-10-CM | POA: Diagnosis not present

## 2021-04-08 DIAGNOSIS — Z981 Arthrodesis status: Secondary | ICD-10-CM | POA: Diagnosis not present

## 2021-05-13 DIAGNOSIS — R5383 Other fatigue: Secondary | ICD-10-CM | POA: Diagnosis not present

## 2021-05-13 DIAGNOSIS — Z1331 Encounter for screening for depression: Secondary | ICD-10-CM | POA: Diagnosis not present

## 2021-05-13 DIAGNOSIS — R41840 Attention and concentration deficit: Secondary | ICD-10-CM | POA: Diagnosis not present

## 2021-05-13 DIAGNOSIS — Z1330 Encounter for screening examination for mental health and behavioral disorders, unspecified: Secondary | ICD-10-CM | POA: Diagnosis not present

## 2021-05-14 DIAGNOSIS — M47812 Spondylosis without myelopathy or radiculopathy, cervical region: Secondary | ICD-10-CM | POA: Diagnosis not present

## 2021-05-14 DIAGNOSIS — M5000 Cervical disc disorder with myelopathy, unspecified cervical region: Secondary | ICD-10-CM | POA: Diagnosis not present

## 2021-05-14 DIAGNOSIS — M4802 Spinal stenosis, cervical region: Secondary | ICD-10-CM | POA: Diagnosis not present

## 2021-05-26 DIAGNOSIS — M4802 Spinal stenosis, cervical region: Secondary | ICD-10-CM | POA: Diagnosis not present

## 2021-05-26 DIAGNOSIS — Z01818 Encounter for other preprocedural examination: Secondary | ICD-10-CM | POA: Diagnosis not present

## 2021-05-26 DIAGNOSIS — M47812 Spondylosis without myelopathy or radiculopathy, cervical region: Secondary | ICD-10-CM | POA: Diagnosis not present

## 2021-05-26 DIAGNOSIS — G40311 Generalized idiopathic epilepsy and epileptic syndromes, intractable, with status epilepticus: Secondary | ICD-10-CM | POA: Diagnosis not present

## 2021-06-02 DIAGNOSIS — M4802 Spinal stenosis, cervical region: Secondary | ICD-10-CM | POA: Diagnosis not present

## 2021-06-02 DIAGNOSIS — M5001 Cervical disc disorder with myelopathy,  high cervical region: Secondary | ICD-10-CM | POA: Diagnosis not present

## 2021-06-02 DIAGNOSIS — M47812 Spondylosis without myelopathy or radiculopathy, cervical region: Secondary | ICD-10-CM | POA: Diagnosis not present

## 2021-06-03 DIAGNOSIS — M5136 Other intervertebral disc degeneration, lumbar region: Secondary | ICD-10-CM | POA: Diagnosis not present

## 2021-07-09 DIAGNOSIS — M5000 Cervical disc disorder with myelopathy, unspecified cervical region: Secondary | ICD-10-CM | POA: Diagnosis not present

## 2021-07-09 NOTE — Progress Notes (Signed)
 The patient returns to clinic after undergoing an anterior cervical diskectomy and fusion with Zero P profile stand alone cage  at C3-4 on 06/02/21.  Since that time the patient has done quite well.  The pain is almost completely gone and their muscle strength is back to normal.  The x-rays today looked quite good with the plate bone graft and screws being in good position.  The incision is well-healed. She is back at [facility] working for the last several weeks. We plan on starting the patient on neck exercises, physical therapy and discharging the patient from our clinic at this time. They will contact our office if they have any further problems.

## 2021-08-23 ENCOUNTER — Encounter: Payer: Self-pay | Admitting: *Deleted

## 2021-08-23 ENCOUNTER — Emergency Department
Admission: EM | Admit: 2021-08-23 | Discharge: 2021-08-23 | Disposition: A | Discharge status: Home / Self Care | Payer: Managed Care, Other (non HMO) | Attending: Emergency Medicine | Admitting: Emergency Medicine

## 2021-08-23 ENCOUNTER — Other Ambulatory Visit: Payer: Self-pay

## 2021-08-23 DIAGNOSIS — S40822A Blister (nonthermal) of left upper arm, initial encounter: Secondary | ICD-10-CM | POA: Diagnosis not present

## 2021-08-23 DIAGNOSIS — X58XXXA Exposure to other specified factors, initial encounter: Secondary | ICD-10-CM | POA: Insufficient documentation

## 2021-08-23 DIAGNOSIS — S50829A Blister (nonthermal) of unspecified forearm, initial encounter: Secondary | ICD-10-CM | POA: Diagnosis not present

## 2021-08-23 DIAGNOSIS — T148XXA Other injury of unspecified body region, initial encounter: Secondary | ICD-10-CM

## 2021-08-23 MED ORDER — CEPHALEXIN 500 MG PO CAPS: 500.0000 mg | ORAL_CAPSULE | Freq: Four times a day (QID) | ORAL | 0 refills | Status: AC

## 2021-08-23 NOTE — Discharge Instructions (Signed)
Please return if you notice that you are getting these lesions elsewhere in your body.  Please return for any new, worsening, or change in symptoms or other concerns.  You may take the antibiotics as prescribed if the redness worsens
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
## 2021-08-23 NOTE — ED Notes (Signed)
Pt DC to home, unable to review Dc instructions with pt as she is upset and arguing that she does not believe dx is correct. Offered to let pt speak with provider regarding issues. Pt declined and begins to speak negatively of hospital and provider. Not able to obtain dc vitals. Pt ambulatory out of dept with steady gait.

## 2021-08-23 NOTE — ED Triage Notes (Signed)
Pt has blister to left arm.  Pt noticed area in shower this evening.   Pt reports tenderness.  Area red.  Pt alert.  Speech clear.

## 2021-08-23 NOTE — ED Provider Triage Note (Signed)
  Emergency Medicine Provider Triage Evaluation Note  Hannah Carlson , a 41 y.o.female,  was evaluated in triage.  Pt complains of blister to the left upper extremity, below the elbow.  She first noticed it today when she was in the shower.  Reports some redness and tenderness around the affected area.  She is unsure if maybe she was bitten by an insect.   Review of Systems  Positive: Blister Negative: Denies fever, chest pain, vomiting  Physical Exam   Vitals:   08/23/21 2041  BP: (!) 129/90  Pulse: 69  Resp: 20  Temp: 99.1 F (37.3 C)  SpO2: 96%   Gen:   Awake, no distress   Resp:  Normal effort  MSK:   Moves extremities without difficulty  Other:  4 cm x 1 cm bullae on the lateral aspect of the left upper arm.  Medical Decision Making  Given the patient's initial medical screening exam, the following diagnostic evaluation has been ordered. The patient will be placed in the appropriate treatment space, once one is available, to complete the evaluation and treatment. I have discussed the plan of care with the patient and I have advised the patient that an ED physician or mid-level practitioner will reevaluate their condition after the test results have been received, as the results may give them additional insight into the type of treatment they may need.    Diagnostics: None immediately.  Treatments: none immediately   Varney Daily, [place] 08/23/21 2058

## 2021-08-29 DIAGNOSIS — L089 Local infection of the skin and subcutaneous tissue, unspecified: Secondary | ICD-10-CM | POA: Diagnosis not present

## 2021-10-21 DIAGNOSIS — M5416 Radiculopathy, lumbar region: Secondary | ICD-10-CM | POA: Diagnosis not present

## 2021-10-21 DIAGNOSIS — M21372 Foot drop, left foot: Secondary | ICD-10-CM | POA: Diagnosis not present

## 2021-11-02 DIAGNOSIS — Z0000 Encounter for general adult medical examination without abnormal findings: Secondary | ICD-10-CM | POA: Diagnosis not present

## 2021-11-02 DIAGNOSIS — M5416 Radiculopathy, lumbar region: Secondary | ICD-10-CM | POA: Diagnosis not present

## 2021-11-02 DIAGNOSIS — Z1330 Encounter for screening examination for mental health and behavioral disorders, unspecified: Secondary | ICD-10-CM | POA: Diagnosis not present

## 2021-11-02 DIAGNOSIS — Z23 Encounter for immunization: Secondary | ICD-10-CM | POA: Diagnosis not present

## 2021-11-02 DIAGNOSIS — Z1331 Encounter for screening for depression: Secondary | ICD-10-CM | POA: Diagnosis not present

## 2021-12-29 ENCOUNTER — Other Ambulatory Visit: Payer: Self-pay

## 2021-12-29 ENCOUNTER — Emergency Department: Payer: Managed Care, Other (non HMO)

## 2021-12-29 ENCOUNTER — Encounter: Payer: Self-pay | Admitting: Emergency Medicine

## 2021-12-29 ENCOUNTER — Inpatient Hospital Stay
Admission: EM | Admit: 2021-12-29 | Discharge: 2021-12-31 | DRG: 101 | Disposition: A | Discharge status: Home / Self Care | Payer: Managed Care, Other (non HMO) | Attending: Internal Medicine | Admitting: Internal Medicine

## 2021-12-29 DIAGNOSIS — J4520 Mild intermittent asthma, uncomplicated: Secondary | ICD-10-CM

## 2021-12-29 DIAGNOSIS — Z91148 Patient's other noncompliance with medication regimen for other reason: Secondary | ICD-10-CM | POA: Diagnosis not present

## 2021-12-29 DIAGNOSIS — I493 Ventricular premature depolarization: Secondary | ICD-10-CM | POA: Diagnosis present

## 2021-12-29 DIAGNOSIS — G40411 Other generalized epilepsy and epileptic syndromes, intractable, with status epilepticus: Secondary | ICD-10-CM | POA: Diagnosis not present

## 2021-12-29 DIAGNOSIS — G40909 Epilepsy, unspecified, not intractable, without status epilepticus: Secondary | ICD-10-CM | POA: Diagnosis present

## 2021-12-29 DIAGNOSIS — E876 Hypokalemia: Secondary | ICD-10-CM | POA: Diagnosis present

## 2021-12-29 DIAGNOSIS — R569 Unspecified convulsions: Principal | ICD-10-CM

## 2021-12-29 DIAGNOSIS — Z885 Allergy status to narcotic agent status: Secondary | ICD-10-CM

## 2021-12-29 DIAGNOSIS — Z888 Allergy status to other drugs, medicaments and biological substances status: Secondary | ICD-10-CM | POA: Diagnosis not present

## 2021-12-29 DIAGNOSIS — Z91199 Patient's noncompliance with other medical treatment and regimen due to unspecified reason: Secondary | ICD-10-CM | POA: Diagnosis not present

## 2021-12-29 DIAGNOSIS — Z8249 Family history of ischemic heart disease and other diseases of the circulatory system: Secondary | ICD-10-CM

## 2021-12-29 DIAGNOSIS — Z87891 Personal history of nicotine dependence: Secondary | ICD-10-CM | POA: Diagnosis not present

## 2021-12-29 DIAGNOSIS — Z886 Allergy status to analgesic agent status: Secondary | ICD-10-CM

## 2021-12-29 DIAGNOSIS — S01512A Laceration without foreign body of oral cavity, initial encounter: Secondary | ICD-10-CM | POA: Diagnosis present

## 2021-12-29 DIAGNOSIS — J45909 Unspecified asthma, uncomplicated: Secondary | ICD-10-CM | POA: Diagnosis present

## 2021-12-29 DIAGNOSIS — Z823 Family history of stroke: Secondary | ICD-10-CM | POA: Diagnosis not present

## 2021-12-29 DIAGNOSIS — Z79899 Other long term (current) drug therapy: Secondary | ICD-10-CM

## 2021-12-29 LAB — COMPREHENSIVE METABOLIC PANEL
ALT: 14 U/L (ref 0–44)
AST: 22 U/L (ref 15–41)
Albumin: 4.4 g/dL (ref 3.5–5.0)
Alkaline Phosphatase: 66 U/L (ref 38–126)
Anion gap: 8 (ref 5–15)
BUN: 13 mg/dL (ref 6–20)
CO2: 27 mmol/L (ref 22–32)
Calcium: 9.2 mg/dL (ref 8.9–10.3)
Chloride: 104 mmol/L (ref 98–111)
Creatinine, Ser: 0.82 mg/dL (ref 0.44–1.00)
GFR, Estimated: >60 mL/min (ref >60)
Glucose, Bld: 116 mg/dL — ABNORMAL HIGH (ref 70–99)
Potassium: 3.4 mmol/L — ABNORMAL LOW (ref 3.5–5.1)
Sodium: 139 mmol/L (ref 135–145)
Total Bilirubin: 1.1 mg/dL (ref 0.3–1.2)
Total Protein: 7.9 g/dL (ref 6.5–8.1)

## 2021-12-29 LAB — URINALYSIS, ROUTINE W REFLEX MICROSCOPIC
Bilirubin Urine: NEGATIVE
Bilirubin Urine: NEGATIVE
Glucose, UA: NEGATIVE mg/dL
Glucose, UA: NEGATIVE mg/dL
Hgb urine dipstick: NEGATIVE
Ketones, ur: 20 mg/dL — AB
Ketones, ur: 20 mg/dL — AB
Leukocytes,Ua: NEGATIVE
Nitrite: NEGATIVE
Nitrite: NEGATIVE
Protein, ur: 30 mg/dL — AB
Protein, ur: 30 mg/dL — AB
Specific Gravity, Urine: 1.03 (ref 1.005–1.030)
Specific Gravity, Urine: 1.029 (ref 1.005–1.030)
pH: 5 (ref 5.0–8.0)
pH: 6 (ref 5.0–8.0)

## 2021-12-29 LAB — URINE DRUG SCREEN, QUALITATIVE (ARMC ONLY)
Amphetamines, Ur Screen: NOT DETECTED
Barbiturates, Ur Screen: NOT DETECTED
Benzodiazepine, Ur Scrn: NOT DETECTED
Cannabinoid 50 Ng, Ur ~~LOC~~: NOT DETECTED
Cocaine Metabolite,Ur ~~LOC~~: NOT DETECTED
MDMA (Ecstasy)Ur Screen: NOT DETECTED
Methadone Scn, Ur: NOT DETECTED
Opiate, Ur Screen: NOT DETECTED
Phencyclidine (PCP) Ur S: NOT DETECTED
Tricyclic, Ur Screen: NOT DETECTED

## 2021-12-29 LAB — CBC WITH DIFFERENTIAL/PLATELET
Abs Immature Granulocytes: 0.02 10*3/uL (ref 0.00–0.07)
Basophils Absolute: 0 10*3/uL (ref 0.0–0.1)
Basophils Relative: 0 %
Eosinophils Absolute: 0.1 10*3/uL (ref 0.0–0.5)
Eosinophils Relative: 1 %
HCT: 41.4 % (ref 36.0–46.0)
Hemoglobin: 13.9 g/dL (ref 12.0–15.0)
Immature Granulocytes: 0 %
Lymphocytes Relative: 23 %
Lymphs Abs: 2.1 10*3/uL (ref 0.7–4.0)
MCH: 29.6 pg (ref 26.0–34.0)
MCHC: 33.6 g/dL (ref 30.0–36.0)
MCV: 88.1 fL (ref 80.0–100.0)
Monocytes Absolute: 0.6 10*3/uL (ref 0.1–1.0)
Monocytes Relative: 7 %
Neutro Abs: 6.3 10*3/uL (ref 1.7–7.7)
Neutrophils Relative %: 69 %
Platelets: 277 10*3/uL (ref 150–400)
RBC: 4.7 MIL/uL (ref 3.87–5.11)
RDW: 12 % (ref 11.5–15.5)
WBC: 9.1 10*3/uL (ref 4.0–10.5)
nRBC: 0 % (ref 0.0–0.2)

## 2021-12-29 LAB — MAGNESIUM: Magnesium: 1.9 mg/dL (ref 1.7–2.4)

## 2021-12-29 LAB — CK: Total CK: 201 U/L (ref 38–234)

## 2021-12-29 LAB — POC URINE PREG, ED: Preg Test, Ur: NEGATIVE

## 2021-12-29 MED ORDER — ACETAMINOPHEN 650 MG RE SUPP: 650.0000 mg | Freq: Four times a day (QID) | RECTAL | Status: DC | PRN

## 2021-12-29 MED ORDER — ORAL CARE MOUTH RINSE
15.0000 mL | OROMUCOSAL | Status: DC
  Administered 2021-12-29 – 2021-12-31 (×8): 15 mL via OROMUCOSAL
  Filled 2021-12-29 (×4): qty 15

## 2021-12-29 MED ORDER — LEVETIRACETAM IN NACL 1000 MG/100ML IV SOLN
1000.0000 mg | Freq: Two times a day (BID) | INTRAVENOUS | Status: DC
  Administered 2021-12-30 – 2021-12-31 (×3): 1000 mg via INTRAVENOUS
  Filled 2021-12-29 (×4): qty 100

## 2021-12-29 MED ORDER — PERAMPANEL 2 MG PO TABS: 4.0000 mg | ORAL_TABLET | Freq: Two times a day (BID) | ORAL | Status: DC

## 2021-12-29 MED ORDER — LEVETIRACETAM IN NACL 1000 MG/100ML IV SOLN
1000.0000 mg | Freq: Once | INTRAVENOUS | Status: AC
  Administered 2021-12-29: 1000 mg via INTRAVENOUS
  Filled 2021-12-29: qty 100

## 2021-12-29 MED ORDER — POTASSIUM CHLORIDE IN NACL 20-0.9 MEQ/L-% IV SOLN
INTRAVENOUS | Status: DC
  Filled 2021-12-29 (×5): qty 1000

## 2021-12-29 MED ORDER — LORAZEPAM 2 MG/ML IJ SOLN: 4.0000 mg | INTRAMUSCULAR | Status: DC | PRN

## 2021-12-29 MED ORDER — ACETAMINOPHEN 325 MG PO TABS: 650.0000 mg | ORAL_TABLET | Freq: Four times a day (QID) | ORAL | Status: DC | PRN

## 2021-12-29 MED ORDER — SODIUM CHLORIDE 0.9 % IV SOLN: 2000.0000 mg | Freq: Once | INTRAVENOUS | Status: DC

## 2021-12-29 MED ORDER — CHLORHEXIDINE GLUCONATE 0.12% ORAL RINSE (MEDLINE KIT)
15.0000 mL | Freq: Two times a day (BID) | OROMUCOSAL | Status: DC
  Administered 2021-12-30 – 2021-12-31 (×4): 15 mL via OROMUCOSAL
  Filled 2021-12-29 (×2): qty 15

## 2021-12-29 MED ORDER — TRAZODONE HCL 50 MG PO TABS: 25.0000 mg | ORAL_TABLET | Freq: Every evening | ORAL | Status: DC | PRN

## 2021-12-29 MED ORDER — ONDANSETRON HCL 4 MG/2ML IJ SOLN: 4.0000 mg | Freq: Four times a day (QID) | INTRAMUSCULAR | Status: DC | PRN

## 2021-12-29 MED ORDER — ENOXAPARIN SODIUM 40 MG/0.4ML IJ SOSY
40.0000 mg | PREFILLED_SYRINGE | INTRAMUSCULAR | Status: DC
  Filled 2021-12-29 (×2): qty 0.4

## 2021-12-29 MED ORDER — ALBUTEROL SULFATE (2.5 MG/3ML) 0.083% IN NEBU: 3.0000 mL | INHALATION_SOLUTION | RESPIRATORY_TRACT | Status: DC | PRN

## 2021-12-29 MED ORDER — TIZANIDINE HCL 2 MG PO TABS: 2.0000 mg | ORAL_TABLET | Freq: Three times a day (TID) | ORAL | Status: DC

## 2021-12-29 MED ORDER — ONDANSETRON HCL 4 MG PO TABS: 4.0000 mg | ORAL_TABLET | Freq: Four times a day (QID) | ORAL | Status: DC | PRN

## 2021-12-29 MED ORDER — PRENATAL 27-0.8 MG PO TABS: 1.0000 | ORAL_TABLET | Freq: Every day | ORAL | Status: DC

## 2021-12-29 MED ORDER — PERAMPANEL 2 MG PO TABS
4.0000 mg | ORAL_TABLET | Freq: Two times a day (BID) | ORAL | Status: DC
  Administered 2021-12-29 – 2021-12-31 (×4): 4 mg via ORAL
  Filled 2021-12-29 (×5): qty 2

## 2021-12-29 MED ORDER — PERAMPANEL 8 MG PO TABS: 8.0000 mg | ORAL_TABLET | Freq: Two times a day (BID) | ORAL | Status: DC

## 2021-12-29 MED ORDER — LEVETIRACETAM 500 MG PO TABS: 1000.0000 mg | ORAL_TABLET | Freq: Two times a day (BID) | ORAL | Status: DC

## 2021-12-29 NOTE — Assessment & Plan Note (Addendum)
-   The patient will be admitted to a medical telemetry bed. - She will be placed on seizures precautions. - This could be related to noncompliance. - We will continue her on Keppra and Fycompa. - Neurology consult was obtained and EEG was ordered. - Dr. Iver Nestle will be following with [place]. - We will place on as needed IV Ativan.

## 2021-12-29 NOTE — Progress Notes (Signed)
Eeg done 
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
## 2021-12-29 NOTE — Procedures (Signed)
Routine EEG Report  Hannah Carlson is a 41 y.o. female with a history of seizures who is undergoing an EEG to evaluate for seizures.  Report: This EEG was acquired with electrodes placed according to the International 10-20 electrode system (including Fp1, Fp2, F3, F4, C3, C4, P3, P4, O1, O2, T3, T4, T5, T6, A1, A2, Fz, Cz, Pz). The following electrodes were missing or displaced: none.  The occipital dominant rhythm was 7 Hz with intermittent bilateral independent focal slowing. This activity is reactive to stimulation. Drowsiness was manifested by background fragmentation; deeper stages of sleep were not identified.  There were frequent runs of sharply contoured generalized rhythmic delta lasting 4-6 seconds. There was one electrographic seizure at 1617 beginning with a run of sharply-contoured high-amplitude diffuse rhythmic delta that displayed evolution over one minute and was followed by diffuse suppression. Photic stimulation and hyperventilation were not performed.  Impression and clinical correlation: This EEG was obtained while awake and drowsy and is abnormal due to:  - mild diffuse slowing indicative of global cerebral dysfunction - frequent brief potentially ictal rhythmic discharges (BIRDs) consisting of runs of sharply contoured generalized rhythmic delta lasting 4-6 seconds increased increased risk for seizure - one electrographic seizure at 1617 beginning with a run of sharply-contoured high-amplitude diffuse rhythmic delta that displayed evolution over one minute and was followed by diffuse suppression  Bing Neighbors, MD [facility] [phone]  If 7pm- 7am, please page neurology on call as listed in AMION.

## 2021-12-29 NOTE — Progress Notes (Signed)
Patient arrived to room 118 @ 2340 via stretcher
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
## 2021-12-29 NOTE — Assessment & Plan Note (Signed)
-   We will continue her albuterol. 

## 2021-12-29 NOTE — ED Provider Triage Note (Signed)
  Emergency Medicine Provider Triage Evaluation Note  Hannah Carlson , a 41 y.o.female,  was evaluated in triage.  Pt complains of seizures.  Patient states that over the past few weeks, she is having several episodes of seizures.  She additionally states that she cannot remember significant events that happened over the past few weeks, including funerals, celebrations, dinners.  She is joined by her pastor, who states that she has not been behaving normally either.  She has not been bathing and has been acting very irritable/combative.  Patient is currently asymptomatic at this time.   Review of Systems  Positive: Seizures Negative: Denies fever, chest pain, vomiting  Physical Exam   Vitals:   12/29/21 1106 12/29/21 1107  BP: (!) 127/91   Pulse: 97   Resp: 16   Temp:  98.5 F (36.9 C)  SpO2: 100%    Gen:   Awake, no distress   Resp:  Normal effort  MSK:   Moves extremities without difficulty  Other:  No facial droop, no gross neurological deficits, normal strength in upper and lower extremities.  Medical Decision Making  Given the patient's initial medical screening exam, the following diagnostic evaluation has been ordered. The patient will be placed in the appropriate treatment space, once one is available, to complete the evaluation and treatment. I have discussed the plan of care with the patient and I have advised the patient that an ED physician or mid-level practitioner will reevaluate their condition after the test results have been received, as the results may give them additional insight into the type of treatment they may need.    Diagnostics: Head CT, UA, labs  Treatments: none immediately   Varney Daily, [place] 12/29/21 1119
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
## 2021-12-29 NOTE — ED Triage Notes (Signed)
Patient arrives ambulatory by POV c/o having seizures over the past few days. Patients pastor states patient had a "rough one" on Sunday then 4 on Monday and two more this morning. Patient states she does not remember the seizures this morning. Patients pastor states yesterday she went to work and was sent home due to not being able to perform her job. Patient jammed left pointer finger and scratched her face during seizure.

## 2021-12-30 ENCOUNTER — Encounter: Payer: Self-pay | Admitting: Family Medicine

## 2021-12-30 DIAGNOSIS — E876 Hypokalemia: Secondary | ICD-10-CM

## 2021-12-30 DIAGNOSIS — J45909 Unspecified asthma, uncomplicated: Secondary | ICD-10-CM

## 2021-12-30 LAB — BASIC METABOLIC PANEL
Anion gap: 5 (ref 5–15)
BUN: 13 mg/dL (ref 6–20)
CO2: 27 mmol/L (ref 22–32)
Calcium: 8.2 mg/dL — ABNORMAL LOW (ref 8.9–10.3)
Chloride: 111 mmol/L (ref 98–111)
Creatinine, Ser: 0.68 mg/dL (ref 0.44–1.00)
GFR, Estimated: >60 mL/min (ref >60)
Glucose, Bld: 104 mg/dL — ABNORMAL HIGH (ref 70–99)
Potassium: 3.4 mmol/L — ABNORMAL LOW (ref 3.5–5.1)
Sodium: 143 mmol/L (ref 135–145)

## 2021-12-30 LAB — CBC
HCT: 35.9 % — ABNORMAL LOW (ref 36.0–46.0)
HCT: 37.3 % (ref 36.0–46.0)
Hemoglobin: 11.7 g/dL — ABNORMAL LOW (ref 12.0–15.0)
Hemoglobin: 12.1 g/dL (ref 12.0–15.0)
MCH: 29 pg (ref 26.0–34.0)
MCH: 29.2 pg (ref 26.0–34.0)
MCHC: 32.6 g/dL (ref 30.0–36.0)
MCHC: 32.4 g/dL (ref 30.0–36.0)
MCV: 88.9 fL (ref 80.0–100.0)
MCV: 89.9 fL (ref 80.0–100.0)
Platelets: 263 10*3/uL (ref 150–400)
Platelets: 263 10*3/uL (ref 150–400)
RBC: 4.04 MIL/uL (ref 3.87–5.11)
RBC: 4.15 MIL/uL (ref 3.87–5.11)
RDW: 12.1 % (ref 11.5–15.5)
RDW: 12.2 % (ref 11.5–15.5)
WBC: 6.9 10*3/uL (ref 4.0–10.5)
WBC: 7.7 10*3/uL (ref 4.0–10.5)
nRBC: 0 % (ref 0.0–0.2)
nRBC: 0 % (ref 0.0–0.2)

## 2021-12-30 LAB — HIV ANTIBODY (ROUTINE TESTING W REFLEX): HIV Screen 4th Generation wRfx: NONREACTIVE

## 2021-12-30 MED ORDER — POTASSIUM CHLORIDE CRYS ER 20 MEQ PO TBCR
40.0000 meq | EXTENDED_RELEASE_TABLET | Freq: Once | ORAL | Status: AC
  Administered 2021-12-30: 40 meq via ORAL
  Filled 2021-12-30: qty 2
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
## 2021-12-30 NOTE — Progress Notes (Signed)
Neurology Progress Note  Patient ID: Hannah Carlson is a 41 y.o. with PMHx of  has a past medical history of Anxiety, Asthma, Seizures (HCC), and Spinal stenosis.   Major interval events:  On pharmacy review with pill counts: "estimate that she has missed ~15 doses of Fycompa over the past 34 days based on fill history and pill count. Per our med history, she has taken this morning's dose and she seems to report taking it BID despite pill count discrepancy"  Subjective: - Improving confusion but still slow to respond at times - No acute complaints   Exam: Vitals:   12/30/21 0422 12/30/21 0906  BP: 113/67 105/68  Pulse: 75 71  Resp: 20 16  Temp: 98.7 F (37.1 C) 98 F (36.7 C)  SpO2: 100% 100%   Gen: In bed, comfortable  Resp: non-labored breathing, no grossly audible wheezing Cardiac: Perfusing extremities well  Abd: soft, nt  Neuro: MS: Oriented to date with delayed latency of response. Remains foggy on some recent details, following commands, speech fluent CN: EMOI to tracking examiner, face symmetric, pupils equal round reactive  Motor: Using all four extremities grossly equally    Pertinent Labs:  Basic Metabolic Panel: Recent Labs  Lab 12/29/21 1114 12/30/21 0339  NA 139 143  K 3.4* 3.4*  CL 104 111  CO2 27 27  GLUCOSE 116* 104*  BUN 13 13  CREATININE 0.82 0.68  CALCIUM 9.2 8.2*  MG 1.9  --     CBC: Recent Labs  Lab 12/29/21 1114 12/30/21 0339  WBC 9.1 6.9  NEUTROABS 6.3  --   HGB 13.9 11.7*  HCT 41.4 35.9*  MCV 88.1 88.9  PLT 277 263    Coagulation Studies: No results for input(s): "LABPROT", "INR" in the last 72 hours.   Repeat UA negative  UDS negative CXR negative  Impression: Improving mental status with re-initiation of seizure meds. Will use Keppra bridge to steady state Fycompa. Long conversation regarding medication adherence importance, seizure precautions, risk of sudden death with epilepsy, risk to her toddler if she is to have a  seizure while carrying him etc.   Recommendations: - Keppra 1000 mg BID x 3 days (last dose 12/16 evening) - Continue home Fycompa 4 mg BID  - appreciate pharmacy / ToC assistance with blister packs or pill organizer to assist with medication adherence, friends at bedside committed to helping ensure medication adherence - At this time patient is not to her baseline, safe to discharge when friends/family able to confirm no further confusion and patient at baseline - Anticipating return to baseline, neurology will be available as needed but please reach out if patient is worsening or having any additional concerns for seizures - Include seizure precautions in discharge instructions (below)   Standard seizure precautions: Per [facility] statutes, patients with seizures are not allowed to drive until  they have been seizure-free for six months. Use caution when using heavy equipment or power tools. Avoid working on ladders or at heights. Take showers instead of baths. Ensure the water temperature is not too high on the home water heater. Do not go swimming alone. When caring for infants or small children, sit down when holding, feeding, or changing them to minimize risk of injury to the child in the event you have a seizure.  To reduce risk of seizures, maintain good sleep hygiene avoid alcohol and illicit drug use, take all anti-seizure medications as prescribed.   Brooke Dare MD-PhD [facility] [phone]

## 2021-12-30 NOTE — Hospital Course (Addendum)
Hannah Carlson is a 41 y.o. Caucasian  female with medical history significant for seizures, asthma, anxiety and spinal stenosis, presented to hospital with recurrent seizures.  Prior to this event she had her last seizure 18 months ago.  This time she had seizures twice in 1 day interval.  She complained of tongue biting and she has not been taking her Fycompa regularly.  She follows [facility].  In the ED EKG showed PVCs.  Chest x-ray without any infiltrate.  CT head scan did not show any acute abnormality.  Patient was given IV Keppra and was started on Fycompa.  Neurology was consulted from the ED.  Assessment and plan.   Recurrent seizures (HCC) Likely secondary to noncompliance to Fycompa.  Patient received Keppra.  Neurology has been consulted.  EEG shows mild diffuse slowing indicative of global cerebral dysfunction.  On as needed Ativan as well.  Will follow neurology recommendation.  No leukocytosis or fever.  UA showed some bacteria.  Urine drug screen was negative.   Asthma, chronic Continue albuterol.  Mild hypokalemia.  Will replace.

## 2021-12-30 NOTE — Progress Notes (Signed)
PROGRESS NOTE    LAUNI ASENCIO  WUJ:[id number] DOB: April 11, 1980 DOA: 12/29/2021 PCP: [facility]    Brief Narrative:  Hannah Carlson is a 41 y.o. Caucasian  female with medical history significant for seizures, asthma, anxiety and spinal stenosis, presented to hospital with recurrent seizures.  Prior to this event she had her last seizure 18 months ago.  This time she had seizures twice in 1 day interval.  She complained of tongue biting and she has not been taking her Fycompa regularly.  She follows [facility].  In the ED EKG showed PVCs.  Chest x-ray without any infiltrate.  CT head scan did not show any acute abnormality.  Patient was given IV Keppra and was started on Fycompa.  Neurology was consulted from the ED.  Assessment and plan.   Recurrent seizures (HCC) Likely secondary to noncompliance to Fycompa.  Patient received Keppra loading dose in the ED and neurology has recommended Keppra for 3 days for bruising with Fycompa.  Friends at bedside reported some confusion and not being at baseline. EEG shows mild diffuse slowing indicative of global cerebral dysfunction.  Continue seizure precautions.  On as needed Ativan as well.  No leukocytosis or fever.  UA showed some bacteria.  Urine drug screen was negative.  Continue seizure precaution.  Neurology recommends disposition Home when at baseline.   Asthma, chronic Continue albuterol.  Appears compensated.  Mild hypokalemia.  Will replace.       DVT prophylaxis: enoxaparin (LOVENOX) injection 40 mg Start: 12/29/21 2200   Code Status:     Code Status: Full Code  Disposition: Home likely 12/31/2021  Status is: Inpatient Remains inpatient appropriate because: Asthma, recurrent seizures   Family Communication: Spoke with multiple friends at bedside  Consultants:  Neurology  Procedures:  EEG  Antimicrobials:  None  Anti-infectives (From admission, onward)    None      Subjective: Today, patient  was seen and examined at bedside.  Multiple friends at bedside stating that she is not completely at her baseline.  Denies any headache, nausea, vomiting, fever, chills or rigor.  Objective: Vitals:   12/29/21 2356 12/30/21 0422 12/30/21 0906 12/30/21 1213  BP: 112/76 113/67 105/68 109/70  Pulse: 71 75 71 76  Resp: (!) 22 20 16 16   Temp: 98 F (36.7 C) 98.7 F (37.1 C) 98 F (36.7 C) 99 F (37.2 C)  TempSrc:    Oral  SpO2: 99% 100% 100% 100%  Weight:      Height:        Intake/Output Summary (Last 24 hours) at 12/30/2021 1505 Last data filed at 12/30/2021 1000 Gross per 24 hour  Intake 617.35 ml  Output --  Net 617.35 ml   Filed Weights   12/29/21 1107  Weight: 68 kg    Physical Examination: Body mass index is 27.44 kg/m.   General:  Average built, not in obvious distress HENT:   No scleral pallor or icterus noted. Oral mucosa is moist.  Chest:  Clear breath sounds.  Diminished breath sounds bilaterally. No crackles or wheezes.  CVS: S1 &S2 heard. No murmur.  Regular rate and rhythm. Abdomen: Soft, nontender, nondistended.  Bowel sounds are heard.   Extremities: No cyanosis, clubbing or edema.  Peripheral pulses are palpable. Psych: Alert, awake and oriented to time and place., normal mood CNS:  No cranial nerve deficits.  Power equal in all extremities.   Skin: Warm and dry.  No rashes noted.  Data Reviewed:  CBC: Recent Labs  Lab 12/29/21 1114 12/30/21 0339  WBC 9.1 6.9  NEUTROABS 6.3  --   HGB 13.9 11.7*  HCT 41.4 35.9*  MCV 88.1 88.9  PLT 277 263    Basic Metabolic Panel: Recent Labs  Lab 12/29/21 1114 12/30/21 0339  NA 139 143  K 3.4* 3.4*  CL 104 111  CO2 27 27  GLUCOSE 116* 104*  BUN 13 13  CREATININE 0.82 0.68  CALCIUM 9.2 8.2*  MG 1.9  --     Liver Function Tests: Recent Labs  Lab 12/29/21 1114  AST 22  ALT 14  ALKPHOS 66  BILITOT 1.1  PROT 7.9  ALBUMIN 4.4     Radiology Studies: DG Chest Port 1 View  Result Date:  12/29/2021 CLINICAL DATA:  Cough, asthma, seizures EXAM: PORTABLE CHEST 1 VIEW COMPARISON:  08/04/2020 FINDINGS: Single frontal view of the chest demonstrates an unremarkable cardiac silhouette. No airspace disease, effusion, or pneumothorax. No acute bony abnormalities. IMPRESSION: 1. No acute intrathoracic process. Electronically Signed   By: Sharlet Salina M.D.   On: 12/29/2021 19:25   EEG adult  Result Date: 12/29/2021 Jefferson Fuel, MD     12/29/2021  6:07 PM Routine EEG Report Hannah Carlson is a 41 y.o. female with a history of seizures who is undergoing an EEG to evaluate for seizures. Report: This EEG was acquired with electrodes placed according to the International 10-20 electrode system (including Fp1, Fp2, F3, F4, C3, C4, P3, P4, O1, O2, T3, T4, T5, T6, A1, A2, Fz, Cz, Pz). The following electrodes were missing or displaced: none. The occipital dominant rhythm was 7 Hz with intermittent bilateral independent focal slowing. This activity is reactive to stimulation. Drowsiness was manifested by background fragmentation; deeper stages of sleep were not identified.  There were frequent runs of sharply contoured generalized rhythmic delta lasting 4-6 seconds. There was one electrographic seizure at 1617 beginning with a run of sharply-contoured high-amplitude diffuse rhythmic delta that displayed evolution over one minute and was followed by diffuse suppression. Photic stimulation and hyperventilation were not performed. Impression and clinical correlation: This EEG was obtained while awake and drowsy and is abnormal due to: - mild diffuse slowing indicative of global cerebral dysfunction - frequent brief potentially ictal rhythmic discharges (BIRDs) consisting of runs of sharply contoured generalized rhythmic delta lasting 4-6 seconds increased increased risk for seizure - one electrographic seizure at 1617 beginning with a run of sharply-contoured high-amplitude diffuse rhythmic delta that displayed  evolution over one minute and was followed by diffuse suppression Bing Neighbors, MD [facility] [phone] If 7pm- 7am, please page neurology on call as listed in AMION.   DG Hand 2 View Left  Result Date: 12/29/2021 CLINICAL DATA:  Trauma EXAM: LEFT HAND - 2 VIEW COMPARISON:  None Available. FINDINGS: There is no evidence of fracture or dislocation. There is no evidence of arthropathy or other focal bone abnormality. Soft tissues are unremarkable. IMPRESSION: No displaced fracture or dislocation is seen in left hand. Electronically Signed   By: Ernie Avena M.D.   On: 12/29/2021 14:04   CT Head Wo Contrast  Result Date: 12/29/2021 CLINICAL DATA:  Mental status changes, seizures EXAM: CT HEAD WITHOUT CONTRAST TECHNIQUE: Contiguous axial images were obtained from the base of the skull through the vertex without intravenous contrast. RADIATION DOSE REDUCTION: This exam was performed according to the departmental dose-optimization program which includes automated exposure control, adjustment of the mA and/or kV according to patient size and/or  use of iterative reconstruction technique. COMPARISON:  08/04/2020 FINDINGS: Brain: Normal ventricular morphology. No midline shift or mass effect. Normal appearance of brain parenchyma. No intracranial hemorrhage, mass lesion, evidence of acute infarction, or extra-axial fluid collection. Vascular: No hyperdense vessels Skull: Intact Sinuses/Orbits: Clear Other: N/A IMPRESSION: Normal exam. Electronically Signed   By: Ulyses Southward M.D.   On: 12/29/2021 11:34      LOS: 1 day   Joycelyn Das, MD [facility] Available via Epic secure chat 7am-7pm After these hours, please refer to coverage provider listed on [email or web address] 12/30/2021, 3:05 PM

## 2021-12-30 NOTE — Discharge Instructions (Signed)
Per [facility] statutes, patients with seizures are not allowed to drive until  they have been seizure-free for six months. Use caution when using heavy equipment or power tools. Avoid working on ladders or at heights. Take showers instead of baths. Ensure the water temperature is not too high on the home water heater. Do not go swimming alone. When caring for infants or small children, sit down when holding, feeding, or changing them to minimize risk of injury to the child in the event you have a seizure.  To reduce risk of seizures, maintain good sleep hygiene avoid alcohol and illicit drug use, take all anti-seizure medications as prescribed.

## 2021-12-31 ENCOUNTER — Other Ambulatory Visit: Payer: Self-pay | Admitting: Internal Medicine

## 2021-12-31 DIAGNOSIS — E876 Hypokalemia: Secondary | ICD-10-CM | POA: Diagnosis not present

## 2021-12-31 DIAGNOSIS — G40909 Epilepsy, unspecified, not intractable, without status epilepticus: Secondary | ICD-10-CM | POA: Diagnosis not present

## 2021-12-31 DIAGNOSIS — J45909 Unspecified asthma, uncomplicated: Secondary | ICD-10-CM | POA: Diagnosis not present

## 2021-12-31 LAB — BASIC METABOLIC PANEL
Anion gap: 3 — ABNORMAL LOW (ref 5–15)
BUN: <5 mg/dL — ABNORMAL LOW (ref 6–20)
CO2: 25 mmol/L (ref 22–32)
Calcium: 8.4 mg/dL — ABNORMAL LOW (ref 8.9–10.3)
Chloride: 114 mmol/L — ABNORMAL HIGH (ref 98–111)
Creatinine, Ser: 0.49 mg/dL (ref 0.44–1.00)
GFR, Estimated: >60 mL/min (ref >60)
Glucose, Bld: 92 mg/dL (ref 70–99)
Potassium: 3.7 mmol/L (ref 3.5–5.1)
Sodium: 142 mmol/L (ref 135–145)

## 2021-12-31 LAB — MAGNESIUM: Magnesium: 1.7 mg/dL (ref 1.7–2.4)

## 2021-12-31 MED ORDER — FYCOMPA 2 MG PO TABS: 4.0000 mg | ORAL_TABLET | Freq: Two times a day (BID) | ORAL | 0 refills | Status: AC

## 2021-12-31 MED ORDER — LEVETIRACETAM 1000 MG PO TABS: 1000.0000 mg | ORAL_TABLET | Freq: Two times a day (BID) | ORAL | 0 refills | Status: AC

## 2021-12-31 MED ORDER — POTASSIUM CHLORIDE CRYS ER 20 MEQ PO TBCR: 20.0000 meq | EXTENDED_RELEASE_TABLET | Freq: Every day | ORAL | 0 refills | Status: AC

## 2021-12-31 NOTE — Discharge Summary (Signed)
Physician Discharge Summary  Hannah Carlson ZOX:[id number] DOB: [birth year] DOA: 12/29/2021  [facility]  Admit date: 12/29/2021 Discharge date: 12/31/2021  Admitted From: Home  Discharge disposition: Home   Recommendations for Outpatient Follow-Up:   Follow up with your primary care provider in one week.  Check CBC, BMP, magnesium in the next visit [facility]  Discharge Diagnosis:   Principal Problem:   Recurrent seizures (HCC) Active Problems:   Asthma, chronic   Hypokalemia   Discharge Condition: Improved.  Diet recommendation:   Regular.  Wound care: None.  Code status: Full.   History of Present Illness:   Hannah Carlson is a 41 y.o. Caucasian  female with medical history significant for seizures, asthma, anxiety and spinal stenosis, presented to hospital with recurrent seizures.  Prior to this event she had her last seizure 18 months ago.  This time she had seizures twice in 1 day interval.  She complained of tongue biting and she has not been taking her Fycompa regularly.  She follows [facility].  In the ED, EKG showed PVCs.  Chest x-ray without any infiltrate.  CT head scan did not show any acute abnormality.  Patient was given IV Keppra and was started on Fycompa.  Neurology was consulted from the ED.    Hospital Course:   Following conditions were addressed during hospitalization as listed below,  Recurrent seizures (HCC) Likely secondary to noncompliance to Fycompa.  Patient received Keppra loading dose in the ED and neurology has recommended Keppra for 3 days for bridging with Fycompa.   EEG showed mild diffuse slowing indicative of global cerebral dysfunction.   No leukocytosis or fever.  UA showed some bacteria.  Urine drug screen was negative.   At this time patient appears to be at her baseline.  Was emphasized on compliance with medications.  Also spoke with the patient's   friend at bedside   Asthma, chronic Continue albuterol.  Appears compensated.   Mild hypokalemia.  Improved after replacement.  Latest potassium of 3.7.  Disposition.  At this time, patient is stable for disposition home with outpatient PCP and neurology follow-up.  Medical Consultants:   Neurology  Procedures:    EEG Subjective:   Today, patient was seen and examined at bedside.  Patient's friend at bedside states much better today.  Less confusion.  No further seizures.  Discharge Exam:   Vitals:   12/31/21 0844 12/31/21 1110  BP: 110/80 105/68  Pulse: 80 79  Resp: 18 20  Temp: 98.5 F (36.9 C)   SpO2: 100% 97%   Vitals:   12/31/21 0013 12/31/21 0437 12/31/21 0844 12/31/21 1110  BP: 117/86 128/81 110/80 105/68  Pulse: 83 93 80 79  Resp: 19 20 18 20   Temp: 98.3 F (36.8 C) 97.9 F (36.6 C) 98.5 F (36.9 C)   TempSrc: Oral Oral    SpO2: 99% 100% 100% 97%  Weight:      Height:        General: Alert awake, not in obvious distress HENT: pupils equally reacting to light,  No scleral pallor or icterus noted. Oral mucosa is moist.  Chest:  Clear breath sounds.  Diminished breath sounds bilaterally. No crackles or wheezes.  CVS: S1 &S2 heard. No murmur.  Regular rate and rhythm. Abdomen: Soft, nontender, nondistended.  Bowel sounds are heard.   Extremities: No cyanosis, clubbing or edema.  Peripheral pulses are palpable. Psych: Alert,  awake and oriented, normal mood CNS:  No cranial nerve deficits.  Power equal in all extremities.   Skin: Warm and dry.  No rashes noted.  The results of significant diagnostics from this hospitalization (including imaging, microbiology, ancillary and laboratory) are listed below for reference.     Diagnostic Studies:   DG Chest Port 1 View  Result Date: 12/29/2021 CLINICAL DATA:  Cough, asthma, seizures EXAM: PORTABLE CHEST 1 VIEW COMPARISON:  08/04/2020 FINDINGS: Single frontal view of the chest demonstrates an unremarkable  cardiac silhouette. No airspace disease, effusion, or pneumothorax. No acute bony abnormalities. IMPRESSION: 1. No acute intrathoracic process. Electronically Signed   By: Sharlet Salina M.D.   On: 12/29/2021 19:25   EEG adult  Result Date: 12/29/2021 Jefferson Fuel, MD     12/29/2021  6:07 PM Routine EEG Report Hannah Carlson is a 41 y.o. female with a history of seizures who is undergoing an EEG to evaluate for seizures. Report: This EEG was acquired with electrodes placed according to the International 10-20 electrode system (including Fp1, Fp2, F3, F4, C3, C4, P3, P4, O1, O2, T3, T4, T5, T6, A1, A2, Fz, Cz, Pz). The following electrodes were missing or displaced: none. The occipital dominant rhythm was 7 Hz with intermittent bilateral independent focal slowing. This activity is reactive to stimulation. Drowsiness was manifested by background fragmentation; deeper stages of sleep were not identified.  There were frequent runs of sharply contoured generalized rhythmic delta lasting 4-6 seconds. There was one electrographic seizure at 1617 beginning with a run of sharply-contoured high-amplitude diffuse rhythmic delta that displayed evolution over one minute and was followed by diffuse suppression. Photic stimulation and hyperventilation were not performed. Impression and clinical correlation: This EEG was obtained while awake and drowsy and is abnormal due to: - mild diffuse slowing indicative of global cerebral dysfunction - frequent brief potentially ictal rhythmic discharges (BIRDs) consisting of runs of sharply contoured generalized rhythmic delta lasting 4-6 seconds increased increased risk for seizure - one electrographic seizure at 1617 beginning with a run of sharply-contoured high-amplitude diffuse rhythmic delta that displayed evolution over one minute and was followed by diffuse suppression Bing Neighbors, MD [facility] [phone] If 7pm- 7am, please page neurology on call as listed  in AMION.   DG Hand 2 View Left  Result Date: 12/29/2021 CLINICAL DATA:  Trauma EXAM: LEFT HAND - 2 VIEW COMPARISON:  None Available. FINDINGS: There is no evidence of fracture or dislocation. There is no evidence of arthropathy or other focal bone abnormality. Soft tissues are unremarkable. IMPRESSION: No displaced fracture or dislocation is seen in left hand. Electronically Signed   By: Ernie Avena M.D.   On: 12/29/2021 14:04   CT Head Wo Contrast  Result Date: 12/29/2021 CLINICAL DATA:  Mental status changes, seizures EXAM: CT HEAD WITHOUT CONTRAST TECHNIQUE: Contiguous axial images were obtained from the base of the skull through the vertex without intravenous contrast. RADIATION DOSE REDUCTION: This exam was performed according to the departmental dose-optimization program which includes automated exposure control, adjustment of the mA and/or kV according to patient size and/or use of iterative reconstruction technique. COMPARISON:  08/04/2020 FINDINGS: Brain: Normal ventricular morphology. No midline shift or mass effect. Normal appearance of brain parenchyma. No intracranial hemorrhage, mass lesion, evidence of acute infarction, or extra-axial fluid collection. Vascular: No hyperdense vessels Skull: Intact Sinuses/Orbits: Clear Other: N/A IMPRESSION: Normal exam. Electronically Signed   By: Ulyses Southward M.D.   On: 12/29/2021 11:34  Labs:   Basic Metabolic Panel: Recent Labs  Lab 12/29/21 1114 12/30/21 0339 12/31/21 0548  NA 139 143 142  K 3.4* 3.4* 3.7  CL 104 111 114*  CO2 27 27 25   GLUCOSE 116* 104* 92  BUN 13 13 <5*  CREATININE 0.82 0.68 0.49  CALCIUM 9.2 8.2* 8.4*  MG 1.9  --  1.7   GFR Estimated Creatinine Clearance: 85.4 mL/min (by C-G formula based on SCr of 0.49 mg/dL). Liver Function Tests: Recent Labs  Lab 12/29/21 1114  AST 22  ALT 14  ALKPHOS 66  BILITOT 1.1  PROT 7.9  ALBUMIN 4.4   No results for input(s): "LIPASE", "AMYLASE" in the last 168  hours. No results for input(s): "AMMONIA" in the last 168 hours. Coagulation profile No results for input(s): "INR", "PROTIME" in the last 168 hours.  CBC: Recent Labs  Lab 12/29/21 1114 12/30/21 0339 12/30/21 1552  WBC 9.1 6.9 7.7  NEUTROABS 6.3  --   --   HGB 13.9 11.7* 12.1  HCT 41.4 35.9* 37.3  MCV 88.1 88.9 89.9  PLT 277 263 263   Cardiac Enzymes: Recent Labs  Lab 12/29/21 1114  CKTOTAL 201   BNP: Invalid input(s): "POCBNP" CBG: No results for input(s): "GLUCAP" in the last 168 hours. D-Dimer No results for input(s): "DDIMER" in the last 72 hours. Hgb A1c No results for input(s): "HGBA1C" in the last 72 hours. Lipid Profile No results for input(s): "CHOL", "HDL", "LDLCALC", "TRIG", "CHOLHDL", "LDLDIRECT" in the last 72 hours. Thyroid function studies No results for input(s): "TSH", "T4TOTAL", "T3FREE", "THYROIDAB" in the last 72 hours.  Invalid input(s): "FREET3" Anemia work up No results for input(s): "VITAMINB12", "FOLATE", "FERRITIN", "TIBC", "IRON", "RETICCTPCT" in the last 72 hours. Microbiology No results found for this or any previous visit (from the past 240 hour(s)).   Discharge Instructions:   Discharge Instructions     Discharge instructions   Complete by: As directed    Follow-up with your primary care physician in 1 week.  Please take your seizure medications exactly as prescribed without any interruption.  Seek medical attention for worsening symptoms. Follow up with your neurologist at [facility] as has been scheduled on Jan 29th.   Increase activity slowly   Complete by: As directed       Allergies as of 12/31/2021       Reactions   Tessalon [benzonatate] Anaphylaxis   Aspirin    "makes me act funny"   Benadryl [diphenhydramine]    hyperactivity   Claritin [loratadine]    "makes me act funny"   Gabapentin Other (See Comments)   Seizure activity   Naproxen    Pregabalin Other (See Comments)   Pt states Lyrica makes her extremely  sleepy   Tramadol         Medication List     TAKE these medications    albuterol 108 (90 Base) MCG/ACT inhaler Commonly known as: VENTOLIN HFA Inhale 2 puffs into the lungs every 4 (four) hours as needed for wheezing or shortness of breath.   docusate sodium 100 MG capsule Commonly known as: COLACE Take 100 mg by mouth 2 (two) times daily.   levETIRAcetam 1000 MG tablet Commonly known as: Keppra Take 1 tablet (1,000 mg total) by mouth 2 (two) times daily for 3 days.   lidocaine 5 % Commonly known as: LIDODERM Place 1 patch onto the skin as needed for pain.   multivitamin-prenatal 27-0.8 MG Tabs tablet Take 1 tablet by mouth daily at  12 noon.   potassium chloride SA 20 MEQ tablet Commonly known as: KLOR-CON M Take 1 tablet (20 mEq total) by mouth daily for 5 days.   tiZANidine 2 MG tablet Commonly known as: ZANAFLEX Take 2-4 mg by mouth 3 (three) times daily.   Fycompa 2 MG tablet Generic drug: perampanel Take 4 mg by mouth 2 (two) times daily.     Follow-up Information     [place] Follow up in 1 week(s).   Specialty: Family Medicine Why: Patient is going to make follow up appt Contact information: [address] [place] [address] [phone]                  Time coordinating discharge: 39 minutes  Signed:  Shajuan Musso  [facility] 12/31/2021, 3:49 PM

## 2021-12-31 NOTE — Plan of Care (Signed)
Problem: Education: Goal: Expressions of having a comfortable level of knowledge regarding the disease process will increase Outcome: Progressing   Problem: Coping: Goal: Ability to adjust to condition or change in health will improve Outcome: Progressing Goal: Ability to identify appropriate support needs will improve Outcome: Progressing   Problem: Health Behavior/Discharge Planning: Goal: Compliance with prescribed medication regimen will improve Outcome: Progressing   Problem: Medication: Goal: Risk for medication side effects will decrease Outcome: Progressing   Problem: Clinical Measurements: Goal: Complications related to the disease process, condition or treatment will be avoided or minimized Outcome: Progressing Goal: Diagnostic test results will improve Outcome: Progressing   Problem: Safety: Goal: Verbalization of understanding the information provided will improve Outcome: Progressing   Problem: Self-Concept: Goal: Level of anxiety will decrease Outcome: Progressing Goal: Ability to verbalize feelings about condition will improve Outcome: Progressing   Problem: Education: Goal: Knowledge of General Education information will improve Description: Including pain rating scale, medication(s)/side effects and non-pharmacologic comfort measures Outcome: Progressing   Problem: Health Behavior/Discharge Planning: Goal: Ability to manage health-related needs will improve Outcome: Progressing   Problem: Clinical Measurements: Goal: Ability to maintain clinical measurements within normal limits will improve Outcome: Progressing Goal: Will remain free from infection Outcome: Progressing Goal: Diagnostic test results will improve Outcome: Progressing Goal: Respiratory complications will improve Outcome: Progressing Goal: Cardiovascular complication will be avoided Outcome: Progressing   Problem: Activity: Goal: Risk for activity intolerance will  decrease Outcome: Progressing   Problem: Nutrition: Goal: Adequate nutrition will be maintained Outcome: Progressing   Problem: Coping: Goal: Level of anxiety will decrease Outcome: Progressing   Problem: Elimination: Goal: Will not experience complications related to bowel motility Outcome: Progressing Goal: Will not experience complications related to urinary retention Outcome: Progressing   Problem: Pain Managment: Goal: General experience of comfort will improve Outcome: Progressing   Problem: Safety: Goal: Ability to remain free from injury will improve Outcome: Progressing   Problem: Skin Integrity: Goal: Risk for impaired skin integrity will decrease Outcome: Progressing   Problem: Education: Goal: Expressions of having a comfortable level of knowledge regarding the disease process will increase Outcome: Progressing   Problem: Coping: Goal: Ability to adjust to condition or change in health will improve Outcome: Progressing Goal: Ability to identify appropriate support needs will improve Outcome: Progressing   Problem: Health Behavior/Discharge Planning: Goal: Compliance with prescribed medication regimen will improve Outcome: Progressing   Problem: Medication: Goal: Risk for medication side effects will decrease Outcome: Progressing   Problem: Clinical Measurements: Goal: Complications related to the disease process, condition or treatment will be avoided or minimized Outcome: Progressing Goal: Diagnostic test results will improve Outcome: Progressing   Problem: Safety: Goal: Verbalization of understanding the information provided will improve Outcome: Progressing   Problem: Self-Concept: Goal: Level of anxiety will decrease Outcome: Progressing Goal: Ability to verbalize feelings about condition will improve Outcome: Progressing   Problem: Education: Goal: Expressions of having a comfortable level of knowledge regarding the disease process will  increase Outcome: Progressing   Problem: Coping: Goal: Ability to adjust to condition or change in health will improve Outcome: Progressing Goal: Ability to identify appropriate support needs will improve Outcome: Progressing   Problem: Health Behavior/Discharge Planning: Goal: Compliance with prescribed medication regimen will improve Outcome: Progressing   Problem: Medication: Goal: Risk for medication side effects will decrease Outcome: Progressing   Problem: Clinical Measurements: Goal: Complications related to the disease process, condition or treatment will be avoided or minimized Outcome: Progressing Goal: Diagnostic test results will  improve Outcome: Progressing   Problem: Safety: Goal: Verbalization of understanding the information provided will improve Outcome: Progressing   Problem: Self-Concept: Goal: Level of anxiety will decrease Outcome: Progressing Goal: Ability to verbalize feelings about condition will improve Outcome: Progressing

## 2021-12-31 NOTE — Progress Notes (Unsigned)
30 day supply of Fycompa prescribed.

## 2022-01-03 ENCOUNTER — Telehealth: Payer: Self-pay | Admitting: *Deleted

## 2022-01-03 NOTE — Patient Outreach (Signed)
  Care Coordination [facility] Note Transition Care Management Unsuccessful Follow-up Telephone Call  Date of discharge and from where:  12/31/21 from [facility]  Attempts:  1st Attempt  Reason for unsuccessful TCM follow-up call:  Left voice message   Estanislado Emms RN, BSN [place]  [facility] RN Care Coordinator

## 2022-01-27 LAB — MISC LABCORP TEST (SEND OUT)
LabCorp test name: 9985
Labcorp test code: 9985

## 2022-02-17 DIAGNOSIS — G40A11 Absence epileptic syndrome, intractable, with status epilepticus: Secondary | ICD-10-CM | POA: Diagnosis not present

## 2022-03-10 DIAGNOSIS — Z840 Family history of diseases of the skin and subcutaneous tissue: Secondary | ICD-10-CM | POA: Diagnosis not present

## 2022-03-10 DIAGNOSIS — M25542 Pain in joints of left hand: Secondary | ICD-10-CM | POA: Diagnosis not present

## 2022-03-10 DIAGNOSIS — R21 Rash and other nonspecific skin eruption: Secondary | ICD-10-CM | POA: Diagnosis not present

## 2022-03-10 DIAGNOSIS — M25541 Pain in joints of right hand: Secondary | ICD-10-CM | POA: Diagnosis not present

## 2022-03-18 IMAGING — CT CT HEAD W/O CM
3 of 4 series · 14 of 47 positions shown, 16 images · non-contrast
Comparison: January 03, 2016.

CLINICAL DATA: Seizure.

EXAM:
CT HEAD WITHOUT CONTRAST
TECHNIQUE: Contiguous axial images were obtained from the base of the skull
through the vertex without intravenous contrast.

[Series 4: head wo recon · axial · 0.35mm/px · z∈[-215,-86]mm · 8 of 33 slices shown, 10 images]
[im 3/33  brain]
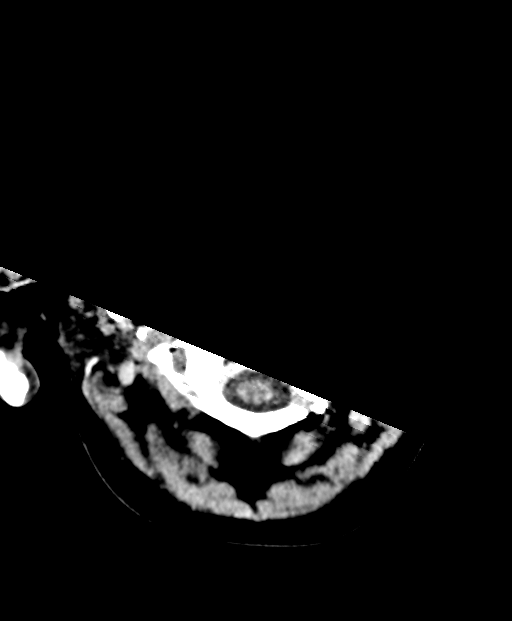
[im 3/33  bone]
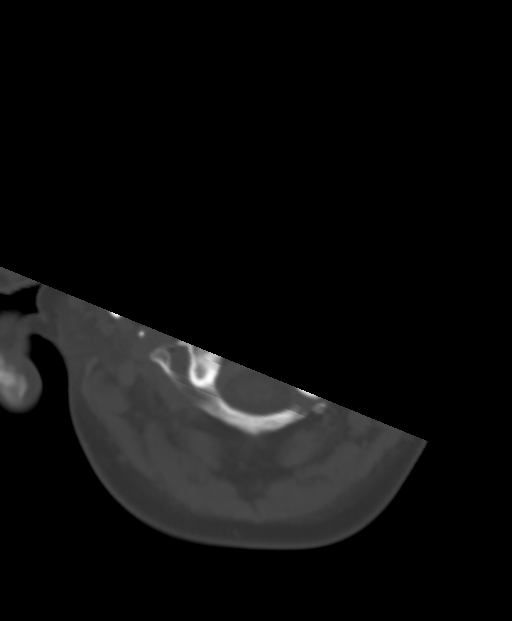
[im 7/33  brain]
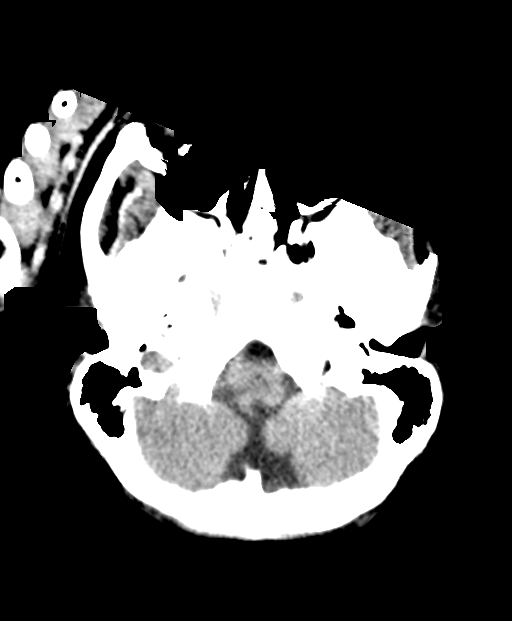
[im 11/33  brain]
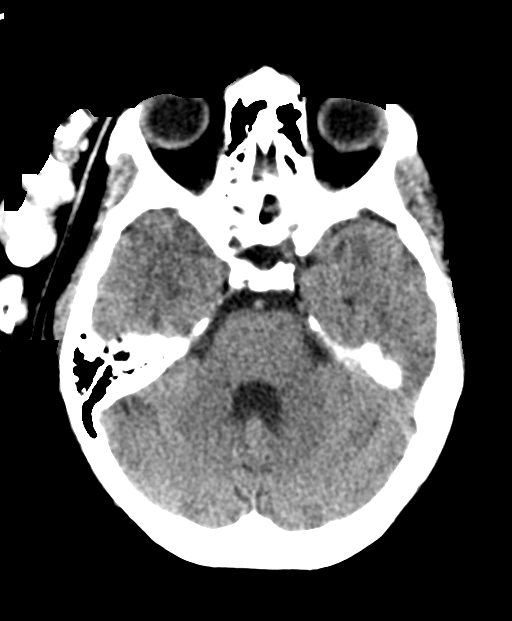
[im 15/33  brain]
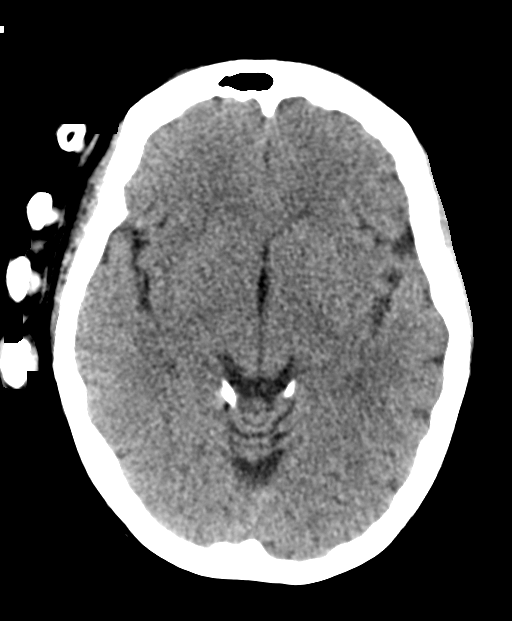
[im 18/33  brain]
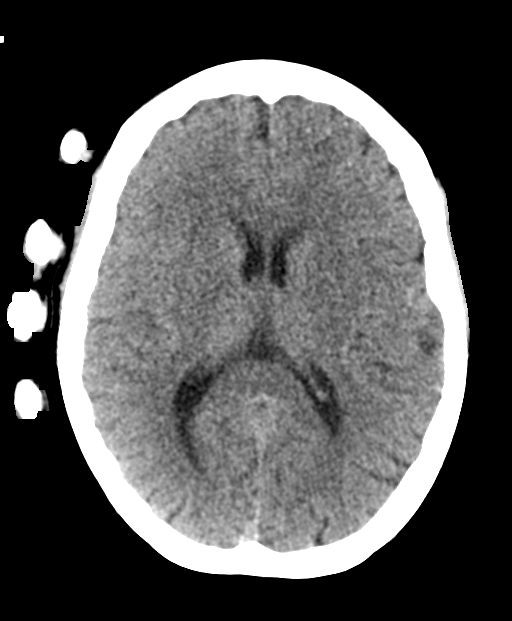
[im 18/33  bone]
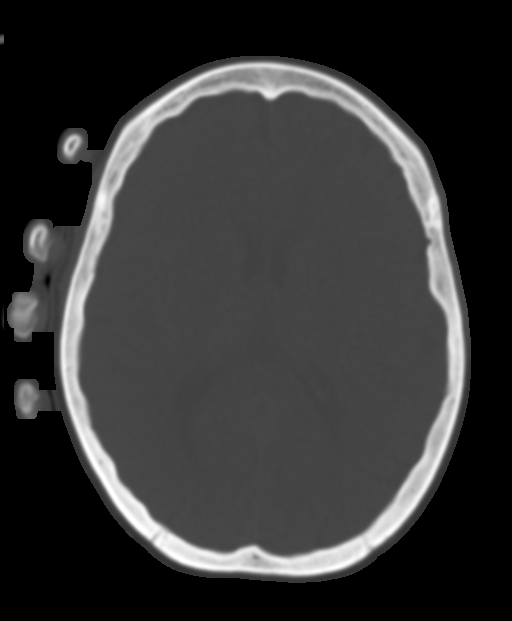
[im 22/33  brain]
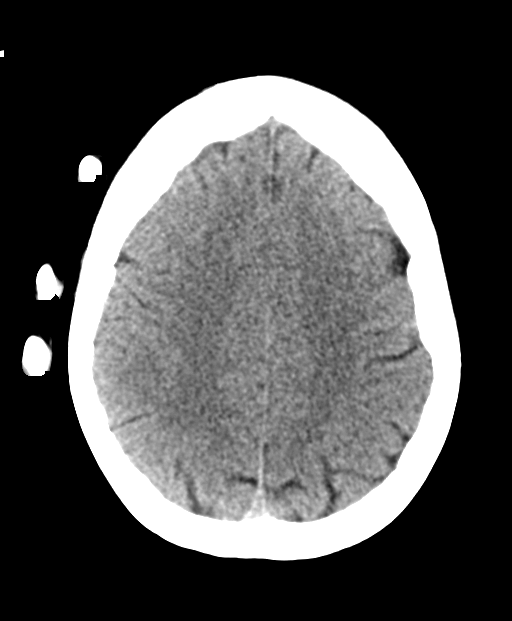
[im 26/33  brain]
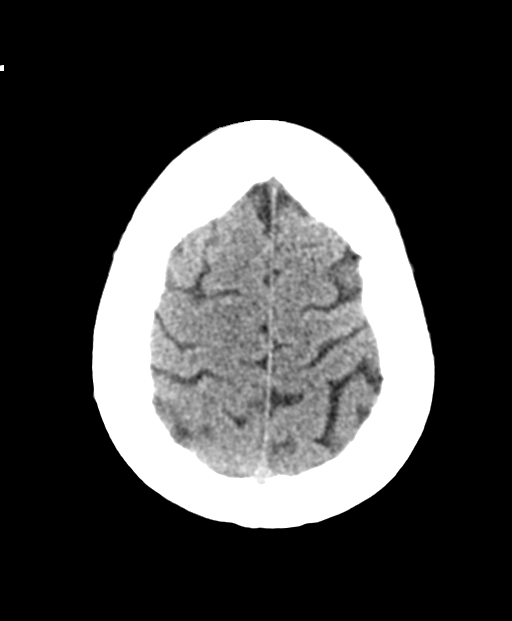
[im 30/33  brain]
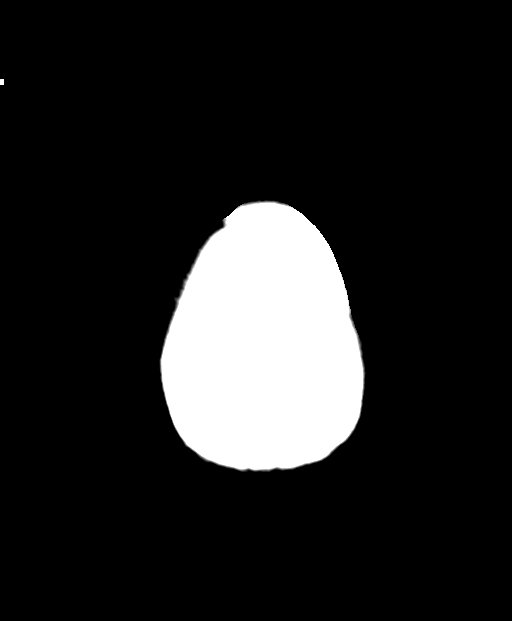

[Series 6: coronal soft tissue · coronal · 0.32mm/px · 3 of 64 slices shown]
[im 22/64  brain]
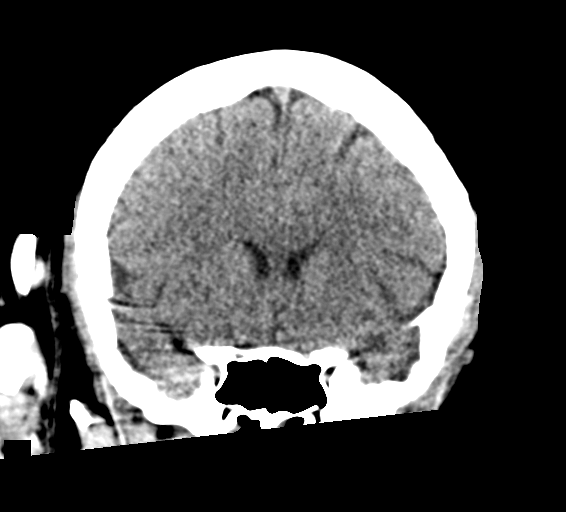
[im 29/64  brain]
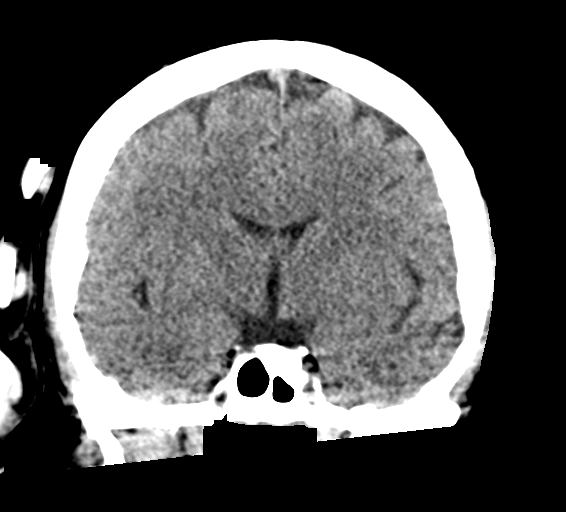
[im 36/64  brain]
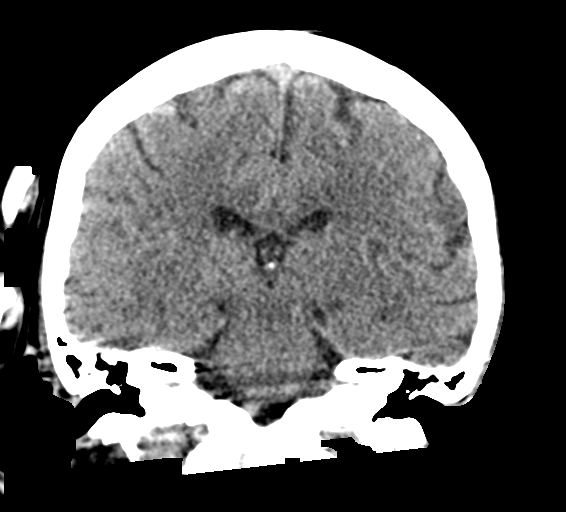

[Series 7: sagittal soft tissue · sagittal · 0.32mm/px · 3 of 54 slices shown]
[im 18/54  brain]
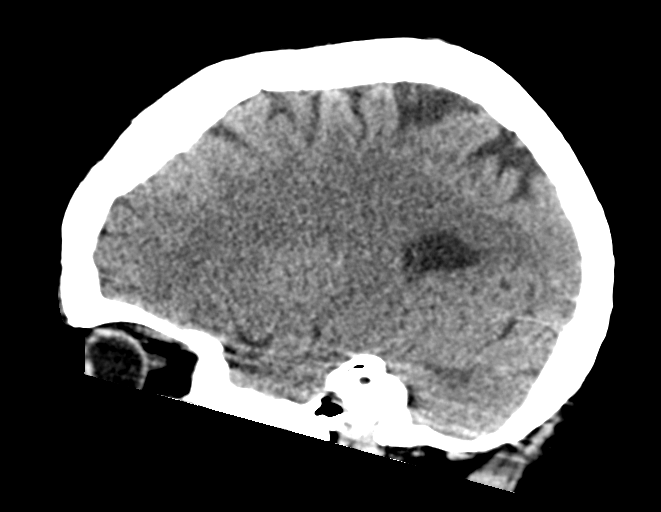
[im 27/54  brain]
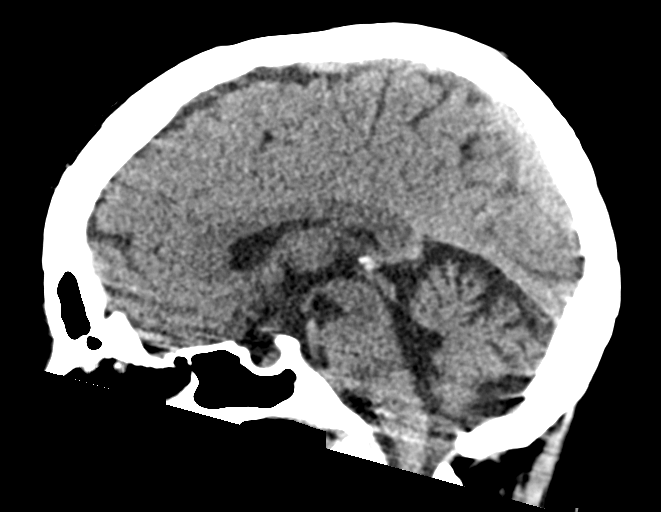
[im 36/54  brain]
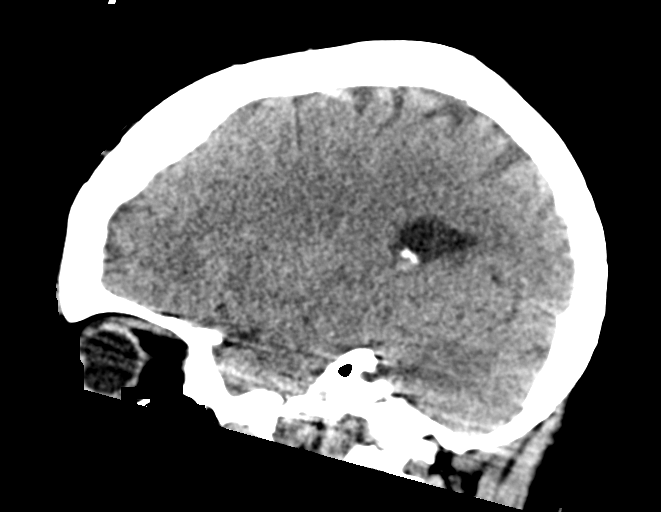

[14 of 47 positions shown; findings below may reference images not displayed]

FINDINGS: Brain: No evidence of acute infarction, hemorrhage, hydrocephalus,
extra-axial collection or mass lesion/mass effect.

Vascular: No hyperdense vessel or unexpected calcification.

Skull: Normal. Negative for fracture or focal lesion.

Sinuses/Orbits: No acute finding.

Other: None.
IMPRESSION: No acute intracranial abnormality seen.

## 2022-03-18 IMAGING — DX DG CHEST 1V PORT
1 series · 2 of 2 positions shown · non-contrast
Comparison: January 09, 2019.

CLINICAL DATA: Unwitnessed seizure.

EXAM:
PORTABLE CHEST 1 VIEW

[Series 1: chest ap · 0.14mm/px · 2 of 2 slices shown]
[im 1/2]
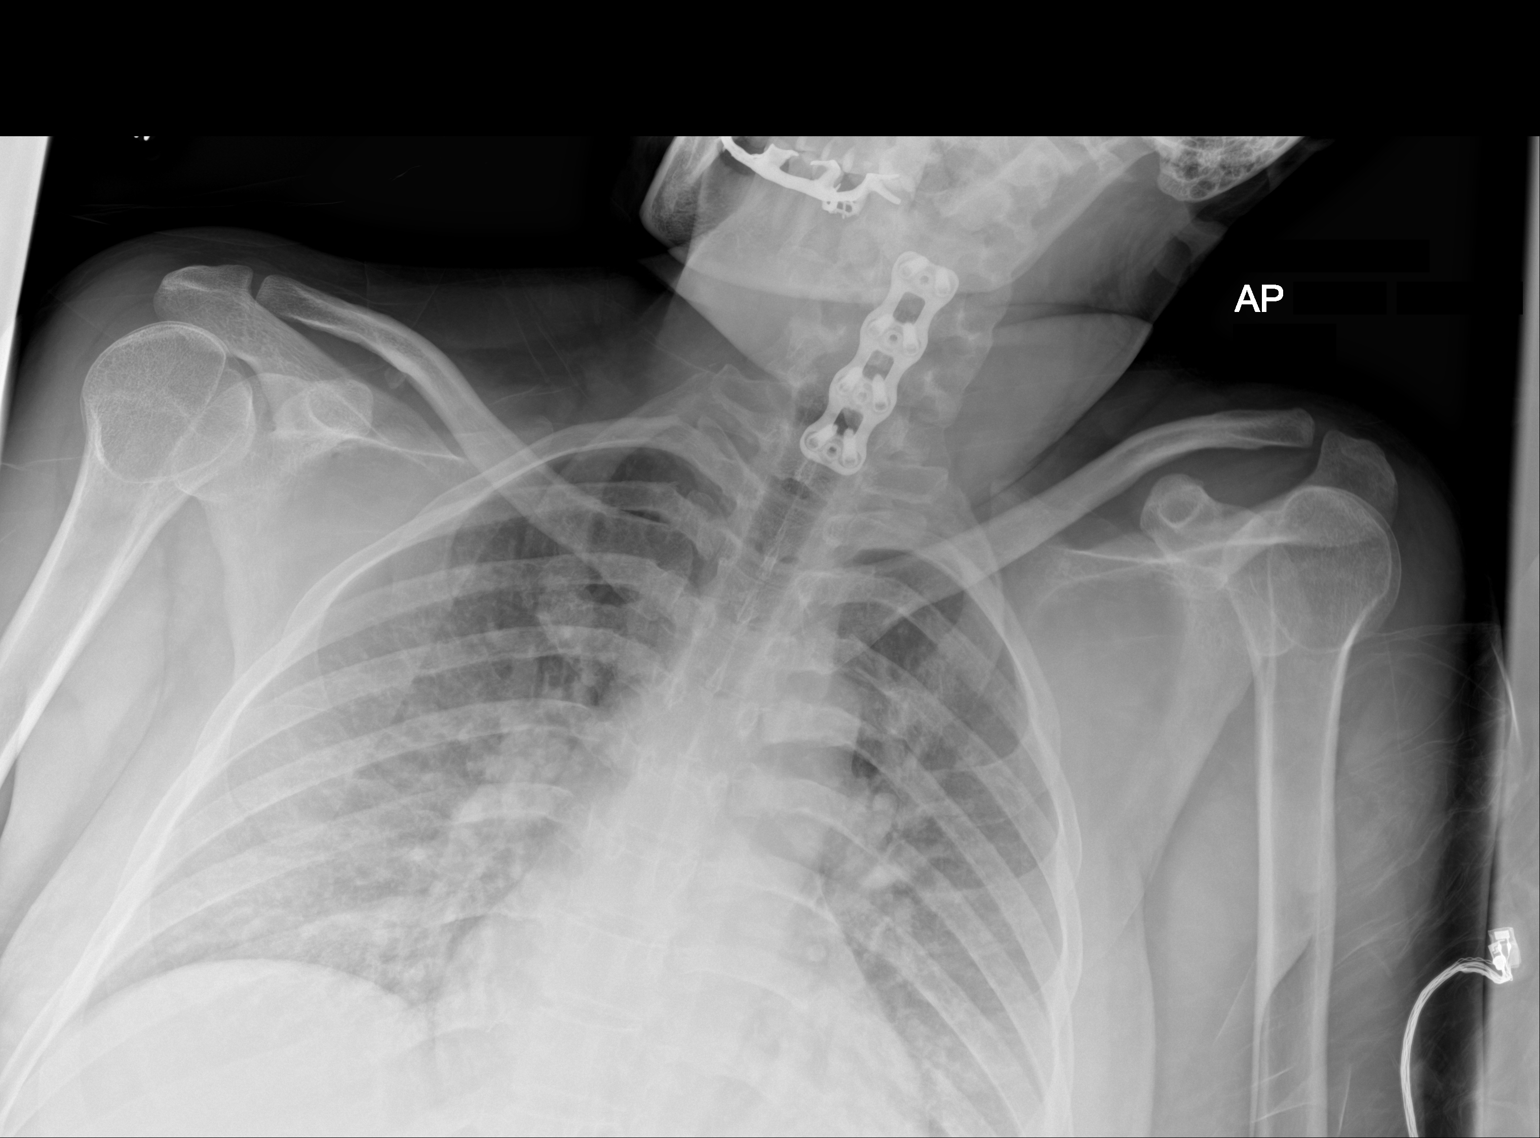
[im 2/2]
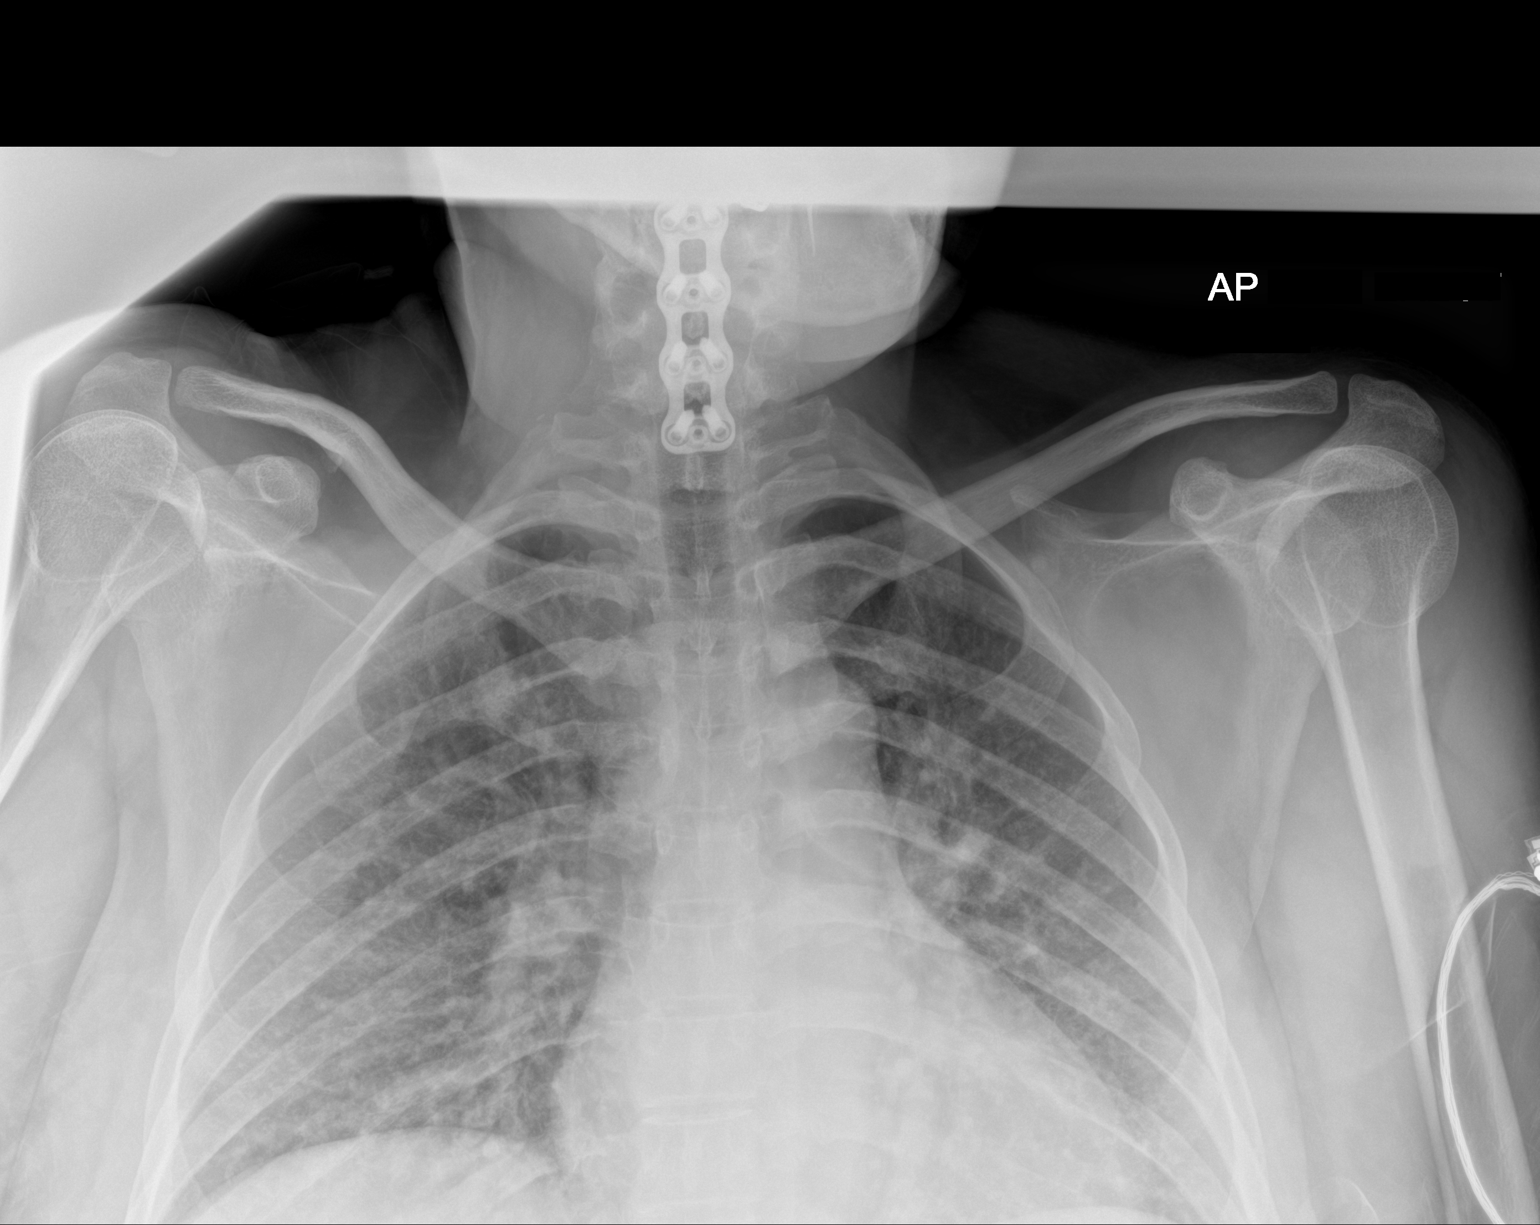

[2 of 2 positions shown; findings below may reference images not displayed]

FINDINGS: The heart size and mediastinal contours are within normal limits.
Both lungs are clear. The visualized skeletal structures are
unremarkable.
IMPRESSION: No active disease.

## 2022-05-12 DIAGNOSIS — Z840 Family history of diseases of the skin and subcutaneous tissue: Secondary | ICD-10-CM | POA: Diagnosis not present

## 2022-05-12 DIAGNOSIS — R21 Rash and other nonspecific skin eruption: Secondary | ICD-10-CM | POA: Diagnosis not present

## 2022-05-12 DIAGNOSIS — R400 Somnolence: Secondary | ICD-10-CM | POA: Diagnosis not present

## 2022-05-13 ENCOUNTER — Telehealth: Payer: Self-pay

## 2022-05-13 NOTE — Telephone Encounter (Signed)
..   Medicaid Managed Care   Unsuccessful Outreach Note  05/13/2022 Name: Hannah Carlson MRN: [id number] DOB: 04-22-1980  Referred by: [facility] Reason for referral : Appointment   An unsuccessful telephone outreach was attempted today. The patient was referred to the case management team for assistance with care management and care coordination.   Follow Up Plan: A HIPAA compliant phone message was left for the patient providing contact information and requesting a return call.  The care management team will reach out to the patient again over the next 7-14 days.   Weston Settle Care Guide  [facility] Managed  [facility]  [phone]

## 2022-08-12 ENCOUNTER — Emergency Department
Admission: EM | Admit: 2022-08-12 | Discharge: 2022-08-12 | Disposition: A | Discharge status: Home / Self Care | Payer: Managed Care, Other (non HMO) | Attending: Student in an Organized Health Care Education/Training Program | Admitting: Student in an Organized Health Care Education/Training Program

## 2022-08-12 ENCOUNTER — Encounter: Payer: Self-pay | Admitting: Emergency Medicine

## 2022-08-12 ENCOUNTER — Other Ambulatory Visit: Payer: Self-pay

## 2022-08-12 ENCOUNTER — Emergency Department: Payer: Managed Care, Other (non HMO)

## 2022-08-12 DIAGNOSIS — Z1152 Encounter for screening for COVID-19: Secondary | ICD-10-CM | POA: Insufficient documentation

## 2022-08-12 DIAGNOSIS — R112 Nausea with vomiting, unspecified: Secondary | ICD-10-CM | POA: Insufficient documentation

## 2022-08-12 DIAGNOSIS — R197 Diarrhea, unspecified: Secondary | ICD-10-CM | POA: Diagnosis not present

## 2022-08-12 DIAGNOSIS — J45909 Unspecified asthma, uncomplicated: Secondary | ICD-10-CM | POA: Insufficient documentation

## 2022-08-12 DIAGNOSIS — R1012 Left upper quadrant pain: Secondary | ICD-10-CM | POA: Diagnosis not present

## 2022-08-12 LAB — CBC WITH DIFFERENTIAL/PLATELET
Abs Immature Granulocytes: 0.02 10*3/uL (ref 0.00–0.07)
Basophils Absolute: 0 10*3/uL (ref 0.0–0.1)
Basophils Relative: 0 %
Eosinophils Absolute: 0 10*3/uL (ref 0.0–0.5)
Eosinophils Relative: 0 %
HCT: 39.4 % (ref 36.0–46.0)
Hemoglobin: 12.9 g/dL (ref 12.0–15.0)
Immature Granulocytes: 0 %
Lymphocytes Relative: 5 %
Lymphs Abs: 0.5 10*3/uL — ABNORMAL LOW (ref 0.7–4.0)
MCH: 28.8 pg (ref 26.0–34.0)
MCHC: 32.7 g/dL (ref 30.0–36.0)
MCV: 87.9 fL (ref 80.0–100.0)
Monocytes Absolute: 0.3 10*3/uL (ref 0.1–1.0)
Monocytes Relative: 3 %
Neutro Abs: 9.3 10*3/uL — ABNORMAL HIGH (ref 1.7–7.7)
Neutrophils Relative %: 92 %
Platelets: 224 10*3/uL (ref 150–400)
RBC: 4.48 MIL/uL (ref 3.87–5.11)
RDW: 12.6 % (ref 11.5–15.5)
WBC: 10.1 10*3/uL (ref 4.0–10.5)
nRBC: 0 % (ref 0.0–0.2)

## 2022-08-12 LAB — SARS CORONAVIRUS 2 BY RT PCR: SARS Coronavirus 2 by RT PCR: NEGATIVE

## 2022-08-12 LAB — URINALYSIS, ROUTINE W REFLEX MICROSCOPIC
Bacteria, UA: NONE SEEN
Bilirubin Urine: NEGATIVE
Glucose, UA: NEGATIVE mg/dL
Ketones, ur: NEGATIVE mg/dL
Leukocytes,Ua: NEGATIVE
Nitrite: NEGATIVE
Protein, ur: NEGATIVE mg/dL
Specific Gravity, Urine: 1.024 (ref 1.005–1.030)
pH: 7 (ref 5.0–8.0)

## 2022-08-12 LAB — COMPREHENSIVE METABOLIC PANEL
ALT: 13 U/L (ref 0–44)
AST: 20 U/L (ref 15–41)
Albumin: 4.2 g/dL (ref 3.5–5.0)
Alkaline Phosphatase: 66 U/L (ref 38–126)
Anion gap: 9 (ref 5–15)
BUN: 12 mg/dL (ref 6–20)
CO2: 23 mmol/L (ref 22–32)
Calcium: 8.4 mg/dL — ABNORMAL LOW (ref 8.9–10.3)
Chloride: 104 mmol/L (ref 98–111)
Creatinine, Ser: 0.69 mg/dL (ref 0.44–1.00)
GFR, Estimated: >60 mL/min (ref >60)
Glucose, Bld: 94 mg/dL (ref 70–99)
Potassium: 3.4 mmol/L — ABNORMAL LOW (ref 3.5–5.1)
Sodium: 136 mmol/L (ref 135–145)
Total Bilirubin: 0.7 mg/dL (ref 0.3–1.2)
Total Protein: 7.7 g/dL (ref 6.5–8.1)

## 2022-08-12 LAB — LIPASE, BLOOD: Lipase: 37 U/L (ref 11–51)

## 2022-08-12 LAB — POC URINE PREG, ED: Preg Test, Ur: NEGATIVE

## 2022-08-12 MED ORDER — ONDANSETRON HCL 4 MG/2ML IJ SOLN
4.0000 mg | Freq: Once | INTRAMUSCULAR | Status: AC
  Administered 2022-08-12: 4 mg via INTRAVENOUS
  Filled 2022-08-12: qty 2

## 2022-08-12 MED ORDER — IOHEXOL 300 MG/ML  SOLN
100.0000 mL | Freq: Once | INTRAMUSCULAR | Status: AC | PRN
  Administered 2022-08-12: 100 mL via INTRAVENOUS

## 2022-08-12 MED ORDER — ALUM & MAG HYDROXIDE-SIMETH 200-200-20 MG/5ML PO SUSP
30.0000 mL | Freq: Once | ORAL | Status: AC
  Administered 2022-08-12: 30 mL via ORAL
  Filled 2022-08-12: qty 30

## 2022-08-12 MED ORDER — ONDANSETRON 4 MG PO TBDP: 4.0000 mg | ORAL_TABLET | Freq: Three times a day (TID) | ORAL | 0 refills | Status: AC | PRN

## 2022-08-12 NOTE — Discharge Instructions (Addendum)
You were seen in the Emergency Department for your abdominal pain. Your blood work and imaging were all normal today without any remarkable abnormalities.  We provided you with IV fluids and medications to help treat your symptoms.  It is most likely that your symptoms today are caused by a viral infection and should improve over the next few days.  It is important that you maintain a gentle diet with plenty of clear fluids.  You can use the medication we provided you for symptomatic relief of the nausea.  You should stay well hydrated. -- Rest, Drink lots of fluids. Advance diet as tolerated, if your have worsening abdominal pain, if you are unable to keep fluids down, should you develop fever greater than 100.4, return to the Emergency Department. If you have any concerns at all, please return to the Emergency Department. -- For the next 48 hours maintain hydration with at least 8 glasses of fluid per day of clear juices- apple or cranberry, broth, jell-O, popsicles, sherbert, tea, ginger ale and dry toast and crackers. -- After 48 hours Progress your diet to soft BRAT Diet: rice, bananas, applesauce and toast: then mashed potatoes and vegetables, -- Advance as tolerated, vegetables, eggs, chicken, fish, fruit -- Refrain from dairy products, fatty greasy, spicy food for one week If you have increasing abdominal pain, bright red blood from rectum, uncontrollable nausea and vomiting, fever > 100.49F, chills, inability to hold down liquids, decrease in urination, or any symptoms   You may take the medication as prescribed, but not more than prescribed as this can lead to fatal heart rhythms if you take more than prescribed.
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
## 2022-08-12 NOTE — ED Triage Notes (Signed)
Pt here with chills and nausea that started this morning. Pt states she vomited a large amount this morning along with diarrhea. Pt states she is having LUQ abd pain. Pt ambulatory to triage.

## 2022-08-18 DIAGNOSIS — G40009 Localization-related (focal) (partial) idiopathic epilepsy and epileptic syndromes with seizures of localized onset, not intractable, without status epilepticus: Secondary | ICD-10-CM | POA: Diagnosis not present

## 2022-08-18 DIAGNOSIS — R4189 Other symptoms and signs involving cognitive functions and awareness: Secondary | ICD-10-CM | POA: Diagnosis not present

## 2022-10-25 DIAGNOSIS — G40009 Localization-related (focal) (partial) idiopathic epilepsy and epileptic syndromes with seizures of localized onset, not intractable, without status epilepticus: Secondary | ICD-10-CM | POA: Diagnosis not present

## 2022-11-03 DIAGNOSIS — Z1331 Encounter for screening for depression: Secondary | ICD-10-CM | POA: Diagnosis not present

## 2022-11-03 DIAGNOSIS — Z13220 Encounter for screening for lipoid disorders: Secondary | ICD-10-CM | POA: Diagnosis not present

## 2022-11-03 DIAGNOSIS — Z0000 Encounter for general adult medical examination without abnormal findings: Secondary | ICD-10-CM | POA: Diagnosis not present

## 2022-11-03 DIAGNOSIS — N920 Excessive and frequent menstruation with regular cycle: Secondary | ICD-10-CM | POA: Diagnosis not present

## 2022-11-03 DIAGNOSIS — M21372 Foot drop, left foot: Secondary | ICD-10-CM | POA: Diagnosis not present

## 2022-11-03 DIAGNOSIS — M5416 Radiculopathy, lumbar region: Secondary | ICD-10-CM | POA: Diagnosis not present

## 2022-11-03 DIAGNOSIS — Z131 Encounter for screening for diabetes mellitus: Secondary | ICD-10-CM | POA: Diagnosis not present

## 2022-11-03 DIAGNOSIS — Z1231 Encounter for screening mammogram for malignant neoplasm of breast: Secondary | ICD-10-CM | POA: Diagnosis not present

## 2022-11-03 DIAGNOSIS — Z1330 Encounter for screening examination for mental health and behavioral disorders, unspecified: Secondary | ICD-10-CM | POA: Diagnosis not present

## 2022-11-17 DIAGNOSIS — G40009 Localization-related (focal) (partial) idiopathic epilepsy and epileptic syndromes with seizures of localized onset, not intractable, without status epilepticus: Secondary | ICD-10-CM | POA: Diagnosis not present

## 2022-11-22 DIAGNOSIS — Z1231 Encounter for screening mammogram for malignant neoplasm of breast: Secondary | ICD-10-CM | POA: Diagnosis not present

## 2022-11-22 DIAGNOSIS — R92333 Mammographic heterogeneous density, bilateral breasts: Secondary | ICD-10-CM | POA: Diagnosis not present

## 2023-01-07 DIAGNOSIS — J101 Influenza due to other identified influenza virus with other respiratory manifestations: Secondary | ICD-10-CM | POA: Diagnosis not present

## 2023-01-16 ENCOUNTER — Emergency Department: Payer: Managed Care, Other (non HMO)

## 2023-01-16 ENCOUNTER — Emergency Department
Admission: EM | Admit: 2023-01-16 | Discharge: 2023-01-16 | Disposition: A | Discharge status: Home / Self Care | Payer: Managed Care, Other (non HMO) | Attending: Emergency Medicine | Admitting: Emergency Medicine

## 2023-01-16 ENCOUNTER — Other Ambulatory Visit: Payer: Self-pay

## 2023-01-16 DIAGNOSIS — J40 Bronchitis, not specified as acute or chronic: Secondary | ICD-10-CM | POA: Diagnosis not present

## 2023-01-16 DIAGNOSIS — R569 Unspecified convulsions: Secondary | ICD-10-CM | POA: Insufficient documentation

## 2023-01-16 LAB — COMPREHENSIVE METABOLIC PANEL
ALT: 16 U/L (ref 0–44)
AST: 22 U/L (ref 15–41)
Albumin: 3.7 g/dL (ref 3.5–5.0)
Alkaline Phosphatase: 61 U/L (ref 38–126)
Anion gap: 8 (ref 5–15)
BUN: 8 mg/dL (ref 6–20)
CO2: 26 mmol/L (ref 22–32)
Calcium: 8.8 mg/dL — ABNORMAL LOW (ref 8.9–10.3)
Chloride: 104 mmol/L (ref 98–111)
Creatinine, Ser: 0.7 mg/dL (ref 0.44–1.00)
GFR, Estimated: >60 mL/min (ref >60)
Glucose, Bld: 117 mg/dL — ABNORMAL HIGH (ref 70–99)
Potassium: 3.8 mmol/L (ref 3.5–5.1)
Sodium: 138 mmol/L (ref 135–145)
Total Bilirubin: 0.5 mg/dL (ref 0.0–1.2)
Total Protein: 7.5 g/dL (ref 6.5–8.1)

## 2023-01-16 LAB — CBC
HCT: 37 % (ref 36.0–46.0)
Hemoglobin: 12.3 g/dL (ref 12.0–15.0)
MCH: 28.9 pg (ref 26.0–34.0)
MCHC: 33.2 g/dL (ref 30.0–36.0)
MCV: 87.1 fL (ref 80.0–100.0)
Platelets: 350 10*3/uL (ref 150–400)
RBC: 4.25 MIL/uL (ref 3.87–5.11)
RDW: 12.2 % (ref 11.5–15.5)
WBC: 9 10*3/uL (ref 4.0–10.5)
nRBC: 0 % (ref 0.0–0.2)

## 2023-01-16 LAB — URINALYSIS, ROUTINE W REFLEX MICROSCOPIC
Bacteria, UA: NONE SEEN
Bilirubin Urine: NEGATIVE
Glucose, UA: NEGATIVE mg/dL
Hgb urine dipstick: NEGATIVE
Ketones, ur: NEGATIVE mg/dL
Leukocytes,Ua: NEGATIVE
Nitrite: NEGATIVE
Protein, ur: 30 mg/dL — AB
Specific Gravity, Urine: 1.026 (ref 1.005–1.030)
pH: 7 (ref 5.0–8.0)

## 2023-01-16 MED ORDER — PERAMPANEL 2 MG PO TABS
4.0000 mg | ORAL_TABLET | Freq: Once | ORAL | Status: AC
  Administered 2023-01-16: 4 mg via ORAL

## 2023-01-16 MED ORDER — HYDROCOD POLI-CHLORPHE POLI ER 10-8 MG/5ML PO SUER: 5.0000 mL | Freq: Two times a day (BID) | ORAL | 0 refills | Status: AC | PRN

## 2023-01-16 MED ORDER — CLONAZEPAM 0.5 MG PO TABS
0.5000 mg | ORAL_TABLET | Freq: Once | ORAL | Status: AC
  Administered 2023-01-16: 0.5 mg via ORAL
  Filled 2023-01-16: qty 1

## 2023-01-16 MED ORDER — HYDROCOD POLI-CHLORPHE POLI ER 10-8 MG/5ML PO SUER: 5.0000 mL | Freq: Once | ORAL | Status: DC

## 2023-01-16 MED ORDER — AZITHROMYCIN 250 MG PO TABS: 250.0000 mg | ORAL_TABLET | Freq: Every day | ORAL | 0 refills | Status: AC

## 2023-01-16 MED ORDER — CLONAZEPAM 0.5 MG PO TABS
0.5000 mg | ORAL_TABLET | Freq: Two times a day (BID) | ORAL | 0 refills | Status: AC
Start: 2023-01-16 — End: 2023-01-23

## 2023-01-16 MED ORDER — AZITHROMYCIN 500 MG PO TABS
500.0000 mg | ORAL_TABLET | Freq: Once | ORAL | Status: AC
  Administered 2023-01-16: 500 mg via ORAL
  Filled 2023-01-16: qty 1

## 2023-01-16 MED ORDER — ALBUTEROL SULFATE (2.5 MG/3ML) 0.083% IN NEBU
2.5000 mg | INHALATION_SOLUTION | Freq: Once | RESPIRATORY_TRACT | Status: AC
  Administered 2023-01-16: 2.5 mg via RESPIRATORY_TRACT
  Filled 2023-01-16: qty 3

## 2023-01-16 MED ORDER — HYDROCOD POLI-CHLORPHE POLI ER 10-8 MG/5ML PO SUER
5.0000 mL | Freq: Once | ORAL | Status: AC
  Administered 2023-01-16: 5 mL via ORAL
  Filled 2023-01-16: qty 5

## 2023-01-16 NOTE — ED Notes (Signed)
Md in with pt again  

## 2023-01-16 NOTE — ED Triage Notes (Addendum)
Pt presents to ED with c/o of 1 seizure today, pt states HX of same, pt unsure of postictal period. Pt states possible she missed a dose of seizure meds. NAD noted.   Pt has abrasion to L side of head due to seizure.   Pt states recently + for FLU A last week.

## 2023-01-16 NOTE — ED Provider Triage Note (Signed)
Emergency Medicine Provider Triage Evaluation Note  Hannah Carlson , a 42 y.o. female  was evaluated in triage.  Pt complains of seizure today with abrasion to left forehead.  Taking medication.  Recently had flu and bronchitis.    Review of Systems  Positive: Hx of grand mal and petite mal seizures. Negative:   Physical Exam  BP 129/89 (BP Location: Right Arm)   Pulse 87   Temp 98.1 F (36.7 C) (Oral)   Resp 18   Ht 5\' 2"  (1.575 m)   Wt 68 kg   SpO2 100%   BMI 27.42 kg/m  Gen:   Awake, no distress   Resp:  Normal effort  MSK:   Moves extremities without difficulty  Other:    Medical Decision Making  Medically screening exam initiated at 10:33 AM.  Appropriate orders placed.  CHAMERE OHNESORGE was informed that the remainder of the evaluation will be completed by another provider, this initial triage assessment does not replace that evaluation, and the importance of remaining in the ED until their evaluation is complete.     Tommi Rumps, PA-C 01/16/23 1039

## 2023-01-16 NOTE — ED Notes (Signed)
Pt reports having a seizure today.  Pt has an abrasion to left side of forehead.  Pt struck the coffee table.  Pt also bit left side of tongue.  No urinary incontinence.  Pt reports taking seizure meds.  Pt had recent flu and bronchitis and thinks it has caused her to have seizure.  Siderails up x 2.  Pt alert  speech clear.

## 2023-01-16 NOTE — ED Notes (Signed)
Meds given.   

## 2023-01-16 NOTE — ED Notes (Signed)
Dr Erma Heritage with pt.  Seizure pad blankets in place on stretcher .

## 2023-01-16 NOTE — Discharge Instructions (Signed)
For your cough, we are going to prescribe azithromycin as well as Tussionex cough medicine.  Take this at night.  For seizures, I have prescribed a 7-day course of Klonopin 0.5 mg twice a day.  I would recommend taking this first thing in the morning, as well as a second dose  in the afternoon.  Do not take the Tussionex within 4 hours of taking the Klonopin.  This can cause significant sedation and respiratory depression. Ideally, only take the Tussionex for 2-3 days for rest then stop.

## 2023-01-16 NOTE — ED Notes (Signed)
Pt up to bathroom.

## 2023-01-16 NOTE — ED Provider Notes (Signed)
[facility] Provider Note    Event Date/Time   First MD Initiated Contact with Patient 01/16/23 1547     (approximate)   History   Seizures   HPI  Hannah Carlson is a 42 y.o. female here with seizure.  The patient states that she has had a cough and fatigue over the last week or so.  She has had subjective chills and sputum production.  Earlier today, she had a witnessed generalized seizure.  She also feels like she has been somewhat out of it.  She has been taking her medications as prescribed.  Denies any recent head trauma.  No focal numbness or weakness.  No shortness of breath.  No known fevers today.     Physical Exam   Triage Vital Signs: ED Triage Vitals  Encounter Vitals Group     BP 01/16/23 1030 129/89     Systolic BP Percentile --      Diastolic BP Percentile --      Pulse Rate 01/16/23 1030 87     Resp 01/16/23 1030 18     Temp 01/16/23 1030 98.1 F (36.7 C)     Temp Source 01/16/23 1030 Oral     SpO2 01/16/23 1030 100 %     Weight 01/16/23 1031 149 lb 14.6 oz (68 kg)     Height 01/16/23 1031 5\' 2"  (1.575 m)     Head Circumference --      Peak Flow --      Pain Score 01/16/23 1030 0     Pain Loc --      Pain Education --      Exclude from Growth Chart --     Most recent vital signs: Vitals:   01/16/23 1900 01/16/23 1912  BP: 112/75 112/75  Pulse: 83 83  Resp:  18  Temp:  98.2 F (36.8 C)  SpO2: 96% 96%     General: Awake, no distress.  CV:  Good peripheral perfusion.  Regular rate and rhythm. Resp:  Normal work of breathing.  Lungs are clear to auscultation bilaterally.  No wheezes or rales. Abd:  No distention.  No tenderness. Other:  Superficial abrasion to the left forehead.  Cranial nerves II through XII intact.  Strength out of 5 bilateral upper and lower extremities.  Normal sensation light touch.  Normal tone throughout.  No seizure-like activity.   ED Results / Procedures / Treatments   Labs (all labs  ordered are listed, but only abnormal results are displayed) Labs Reviewed  COMPREHENSIVE METABOLIC PANEL - Abnormal; Notable for the following components:      Result Value   Glucose, Bld 117 (*)    Calcium 8.8 (*)    All other components within normal limits  URINALYSIS, ROUTINE W REFLEX MICROSCOPIC - Abnormal; Notable for the following components:   Color, Urine YELLOW (*)    APPearance HAZY (*)    Protein, ur 30 (*)    All other components within normal limits  CBC     EKG    RADIOLOGY CT head: Negative Chest x-ray: Clear DG lumbar spine: Degenerative changes   I also independently reviewed and agree with radiologist interpretations.   PROCEDURES:  Critical Care performed: No   MEDICATIONS ORDERED IN ED: Medications  clonazePAM (KLONOPIN) tablet 0.5 mg (0.5 mg Oral Given 01/16/23 1633)  albuterol (PROVENTIL) (2.5 MG/3ML) 0.083% nebulizer solution 2.5 mg (2.5 mg Inhalation Given 01/16/23 1633)  azithromycin (ZITHROMAX) tablet 500 mg (500 mg  Oral Given 01/16/23 2131)  chlorpheniramine-HYDROcodone (TUSSIONEX) 10-8 MG/5ML suspension 5 mL (5 mLs Oral Given 01/16/23 2132)  perampanel [facility]) tablet 4 mg (4 mg Oral Given 01/16/23 2133)     IMPRESSION / MDM / ASSESSMENT AND PLAN / ED COURSE  I reviewed the triage vital signs and the nursing notes.                              Differential diagnosis includes, but is not limited to, breakthrough seizure, bronchitis, pneumonia, electrolyte abnormality, dehydration, medication nonadherence  Patient's presentation is most consistent with acute presentation with potential threat to life or bodily function.  The patient is on the cardiac monitor to evaluate for evidence of arrhythmia and/or significant heart rate changes  42 year old well-appearing female here with cough, recent illness, and breakthrough seizure.  Regarding her seizure, suspect this was breakthrough in the setting of recent illness as well as sleep  deprivation.  Chest x-ray is clear but she does have some slight rhonchi on exam, will treat for possible bronchitis.  No signs of significant trauma from her seizure on CT head and imaging of the lumbar spine.  CMP unremarkable.  CBC unremarkable.  Urinalysis without signs UTI.  Patient was given Klonopin and monitored in the ED with no ongoing seizure activity  . I had a very long discussion with the patient regarding medication options.  Patient states that the main reason she had a seizure was lack of sleep which is due to coughing.  I do think it is reasonable to give her an antitussive and treat her bronchitis.  For now, given that she is on appropriate antiepileptics otherwise, will also give her a bridge of Klonopin.  She is aware that she cannot take the Klonopin in addition to the antitussive.  She will call her neurologist to set up an appointment.  Encouraged hydration, good rest, and return precautions.    FINAL CLINICAL IMPRESSION(S) / ED DIAGNOSES   Final diagnoses:  Seizure (HCC)  Bronchitis     Rx / DC Orders   ED Discharge Orders          Ordered    azithromycin (ZITHROMAX Z-PAK) 250 MG tablet  Daily        01/16/23 2143    clonazePAM (KLONOPIN) 0.5 MG tablet  2 times daily        01/16/23 2143    chlorpheniramine-HYDROcodone (TUSSIONEX) 10-8 MG/5ML  Every 12 hours PRN        01/16/23 2143             Note:  This document was prepared using Dragon voice recognition software and may include unintentional dictation errors.   Shaune Pollack, MD 01/16/23 2204

## 2023-02-07 DIAGNOSIS — G40A11 Absence epileptic syndrome, intractable, with status epilepticus: Secondary | ICD-10-CM | POA: Diagnosis not present

## 2023-02-07 DIAGNOSIS — R569 Unspecified convulsions: Secondary | ICD-10-CM | POA: Diagnosis not present

## 2023-02-09 DIAGNOSIS — G40409 Other generalized epilepsy and epileptic syndromes, not intractable, without status epilepticus: Secondary | ICD-10-CM | POA: Diagnosis not present

## 2023-04-11 DIAGNOSIS — N390 Urinary tract infection, site not specified: Secondary | ICD-10-CM | POA: Diagnosis not present

## 2023-04-11 DIAGNOSIS — N898 Other specified noninflammatory disorders of vagina: Secondary | ICD-10-CM | POA: Diagnosis not present

## 2023-04-11 DIAGNOSIS — R350 Frequency of micturition: Secondary | ICD-10-CM | POA: Diagnosis not present

## 2023-05-04 DIAGNOSIS — G40409 Other generalized epilepsy and epileptic syndromes, not intractable, without status epilepticus: Secondary | ICD-10-CM | POA: Diagnosis not present

## 2023-06-06 DIAGNOSIS — M4312 Spondylolisthesis, cervical region: Secondary | ICD-10-CM | POA: Diagnosis not present

## 2023-06-06 DIAGNOSIS — M549 Dorsalgia, unspecified: Secondary | ICD-10-CM | POA: Diagnosis not present

## 2023-06-06 DIAGNOSIS — M542 Cervicalgia: Secondary | ICD-10-CM | POA: Diagnosis not present

## 2023-09-02 ENCOUNTER — Other Ambulatory Visit: Payer: Self-pay

## 2023-09-02 ENCOUNTER — Emergency Department

## 2023-09-02 ENCOUNTER — Emergency Department
Admission: EM | Admit: 2023-09-02 | Discharge: 2023-09-02 | Disposition: A | Discharge status: Home / Self Care | Attending: Emergency Medicine | Admitting: Emergency Medicine

## 2023-09-02 DIAGNOSIS — M542 Cervicalgia: Secondary | ICD-10-CM

## 2023-09-02 DIAGNOSIS — M25512 Pain in left shoulder: Secondary | ICD-10-CM | POA: Insufficient documentation

## 2023-09-02 DIAGNOSIS — M62838 Other muscle spasm: Secondary | ICD-10-CM | POA: Insufficient documentation

## 2023-09-02 MED ORDER — PREDNISONE 10 MG (21) PO TBPK: ORAL_TABLET | ORAL | 0 refills | Status: AC

## 2023-09-02 MED ORDER — PREDNISONE 10 MG (21) PO TBPK: ORAL_TABLET | ORAL | 0 refills | Status: DC

## 2023-09-02 NOTE — ED Triage Notes (Addendum)
 C/o L shoulder pain and neck pain that has increasingly gotten worse x2 months. PM: degenerative disc dx, c2-c7 replacement with metal/screws. Pt has full ROM but expresses pain. Pt taken OTC without relief. GCS 15, ambulatory in triage.

## 2023-09-02 NOTE — ED Provider Notes (Signed)
 [place] EMERGENCY DEPARTMENT AT [facility] Provider Note   CSN: [id number] Arrival date & time: 09/02/23  9249     Patient presents with: Neck Pain and Shoulder Pain   Hannah Carlson is a 43 y.o. female.   Patient here for neck and shoulder pain.  Noting the past several months has had increased pain of lower neck radiating into the left shoulder.  No numbness or weakness but does have some paresthesias pins-and-needles in the left arm at times not currently there.  Denying any injury.  No fevers or chills.  Does have history of cervical surgery at [facility] with Dr. Ozell Hannah Carlson although she has not been able to follow-up with him.  Has been taking primarily Tylenol  as well as tizanidine  intermittently without any improvement.  Tries to avoid sedating medication as she has to work at [employer].   Neck Pain Associated symptoms: no chest pain, no fever, no numbness and no weakness   Shoulder Pain Associated symptoms: neck pain   Associated symptoms: no fever        Prior to Admission medications   Medication Sig Start Date End Date Taking? Authorizing Provider  predniSONE  (STERAPRED UNI-PAK 21 TAB) 10 MG (21) TBPK tablet Take as directed 09/02/23  Yes Christna Kulick, PA-C  albuterol  (VENTOLIN  HFA) 108 (90 Base) MCG/ACT inhaler Inhale 2 puffs into the lungs every 4 (four) hours as needed for wheezing or shortness of breath. 01/09/19   Menshew, Candida LULLA Kings, PA-C  chlorpheniramine-HYDROcodone (TUSSIONEX) 10-8 MG/5ML Take 5 mLs by mouth every 12 (twelve) hours as needed for cough. 01/16/23   Angelena Smalls, MD  clonazePAM  (KLONOPIN ) 0.5 MG tablet Take 1 tablet (0.5 mg total) by mouth 2 (two) times daily for 7 days. 01/16/23 01/23/23  Angelena Smalls, MD  docusate sodium  (COLACE) 100 MG capsule Take 100 mg by mouth 2 (two) times daily. 07/30/20   [provider]  levETIRAcetam  (KEPPRA ) 1000 MG tablet Take 1 tablet (1,000 mg total) by mouth 2 (two) times daily for 3 days.  12/31/21 01/03/22  Pokhrel, Laxman, MD  lidocaine (LIDODERM) 5 % Place 1 patch onto the skin as needed for pain. Patient not taking: Reported on 12/29/2021 07/16/20   [provider]  ondansetron  (ZOFRAN -ODT) 4 MG disintegrating tablet Take 1 tablet (4 mg total) by mouth every 8 (eight) hours as needed. 08/12/22   Poggi, Jenna E, PA-C  potassium chloride  SA (KLOR-CON  M) 20 MEQ tablet Take 1 tablet (20 mEq total) by mouth daily for 5 days. 12/31/21 01/05/22  Pokhrel, Laxman, MD  Prenatal Vit-Fe Fumarate-FA (MULTIVITAMIN-PRENATAL) 27-0.8 MG TABS tablet Take 1 tablet by mouth daily at 12 noon. Patient not taking: Reported on 12/29/2021    [provider]    Allergies: Tessalon [benzonatate], Aspirin, Benadryl [diphenhydramine], Claritin [loratadine], Gabapentin, Naproxen , Pregabalin, and Tramadol    Review of Systems  Constitutional:  Negative for chills and fever.  HENT:  Negative for congestion.   Respiratory:  Negative for cough and shortness of breath.   Cardiovascular:  Negative for chest pain.  Gastrointestinal:  Negative for abdominal pain, diarrhea, nausea and vomiting.  Genitourinary:  Negative for dysuria.  Musculoskeletal:  Positive for arthralgias and neck pain.  Neurological:  Negative for weakness and numbness.    Updated Vital Signs BP 128/71   Pulse 73   Temp 98.5 F (36.9 C)   Resp 16   Ht 5' 2 (1.575 m)   Wt 68 kg   SpO2 100%   BMI  27.44 kg/m   Physical Exam Vitals reviewed.  Constitutional:      Appearance: Normal appearance.  HENT:     Head: Normocephalic and atraumatic.     Nose: Nose normal.  Cardiovascular:     Pulses: Normal pulses.  Pulmonary:     Effort: Pulmonary effort is normal.  Musculoskeletal:     Cervical back: Normal range of motion.     Comments: Cervical range of motion full.  Anterior cervical surgical scar.  No redness no rash.  No neck rigidity.  No midline tenderness.  Does have some left paraspinal tenderness.   Upper extremity strength and sensation equal bilateral.  Left shoulder full range of motion nontender.  Neurological:     Mental Status: She is alert and oriented to person, place, and time. Mental status is at baseline.  Psychiatric:        Mood and Affect: Mood normal.        Behavior: Behavior normal.     (all labs ordered are listed, but only abnormal results are displayed) Labs Reviewed - No data to display  EKG: None  Radiology: DG Shoulder Left Result Date: 09/02/2023 CLINICAL DATA:  Worsening left shoulder pain for 2 months EXAM: LEFT SHOULDER - 2+ VIEW COMPARISON:  06/25/2015 FINDINGS: Frontal, transscapular, and axillary views of the left shoulder are obtained. No acute fracture, subluxation, or dislocation. Joint spaces are relatively well preserved. Soft tissues are unremarkable. Visualized portions of the left chest are clear. IMPRESSION: 1. Unremarkable left shoulder. Electronically Signed   By: Ozell Daring M.D.   On: 09/02/2023 08:45   DG Cervical Spine Complete Result Date: 09/02/2023 CLINICAL DATA:  Left shoulder and neck pain worsening for 2 months, history of prior cervical surgery EXAM: CERVICAL SPINE - COMPLETE 4+ VIEW COMPARISON:  01/06/2018 FINDINGS: Frontal, bilateral oblique, lateral views of the cervical spine are obtained. ACDF is noted at C3-4, with stable surgical hardware from prior C4-C7 ACDF. No evidence of orthopedic hardware failure or loosening. Straightening of cervical spine due to multilevel ACDF. Otherwise alignment is anatomic. There is mild to moderate spondylosis and facet hypertrophy at the C2-3 and C7-T1 levels. Neural foramina appear grossly patent. No acute fractures. The prevertebral soft tissues are unremarkable. Lung apices are clear. IMPRESSION: 1. Multilevel postsurgical changes as above, with surgical hardware from C3 through C7. 2. Mild-to-moderate degenerative changes at C2-3 and C7-T1 adjacent to the surgical levels. 3. No acute bony  abnormality. Electronically Signed   By: Ozell Daring M.D.   On: 09/02/2023 08:45     Procedures   Medications Ordered in the ED - No data to display                                  Medical Decision Making Patient here for neck and left shoulder pain with history of ACDF at [facility].  No numbness no weakness no rigidity.  No recent surgery or trauma.  No prior injections.  Not acutely concern for infectious or acute etiology.  X-ray showing degenerative disease.  Pain seems to be emanating from her cervical spine with paraspinal symptoms and some mild radicular symptoms none currently.  She will require nonsedating medication will opt for steroids she has no history of diabetes or hyperglycemia.  She will follow-up with her surgeon.  Given return precautions here.  Amount and/or Complexity of Data Reviewed Radiology: ordered.        Final diagnoses:  Cervical paraspinal muscle spasm  Cervicalgia    ED Discharge Orders          Ordered    predniSONE  (STERAPRED UNI-PAK 21 TAB) 10 MG (21) TBPK tablet        09/02/23 0903               Kingston Mallick, PA-C 09/02/23 9095    Suzanne Kirsch, MD 09/02/23 [phone]

## 2023-11-23 ENCOUNTER — Ambulatory Visit: Attending: Nurse Practitioner | Admitting: Physical Therapy

## 2023-11-23 ENCOUNTER — Encounter: Payer: Self-pay | Admitting: Physical Therapy

## 2023-11-23 DIAGNOSIS — M6281 Muscle weakness (generalized): Secondary | ICD-10-CM | POA: Diagnosis present

## 2023-11-23 DIAGNOSIS — M546 Pain in thoracic spine: Secondary | ICD-10-CM | POA: Insufficient documentation

## 2023-11-23 DIAGNOSIS — M5412 Radiculopathy, cervical region: Secondary | ICD-10-CM | POA: Insufficient documentation
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
## 2023-11-28 ENCOUNTER — Ambulatory Visit

## 2023-11-28 DIAGNOSIS — M5412 Radiculopathy, cervical region: Secondary | ICD-10-CM | POA: Diagnosis not present

## 2023-11-28 DIAGNOSIS — M546 Pain in thoracic spine: Secondary | ICD-10-CM

## 2023-11-28 DIAGNOSIS — M6281 Muscle weakness (generalized): Secondary | ICD-10-CM
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
## 2023-11-30 ENCOUNTER — Ambulatory Visit: Admitting: Physical Therapy

## 2023-12-04 ENCOUNTER — Ambulatory Visit: Admitting: Physical Therapy

## 2023-12-06 ENCOUNTER — Ambulatory Visit: Admitting: Physical Therapy

## 2023-12-06 ENCOUNTER — Encounter: Payer: Self-pay | Admitting: Physical Therapy

## 2023-12-06 DIAGNOSIS — M5412 Radiculopathy, cervical region: Secondary | ICD-10-CM

## 2023-12-06 DIAGNOSIS — M546 Pain in thoracic spine: Secondary | ICD-10-CM

## 2023-12-06 DIAGNOSIS — M6281 Muscle weakness (generalized): Secondary | ICD-10-CM
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
## 2023-12-11 ENCOUNTER — Encounter: Payer: Self-pay | Admitting: Physical Therapy
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
## 2023-12-12 ENCOUNTER — Encounter: Payer: Self-pay | Admitting: Physical Therapy

## 2023-12-12 ENCOUNTER — Ambulatory Visit: Admitting: Physical Therapy

## 2023-12-12 DIAGNOSIS — M6281 Muscle weakness (generalized): Secondary | ICD-10-CM

## 2023-12-12 DIAGNOSIS — M5412 Radiculopathy, cervical region: Secondary | ICD-10-CM | POA: Diagnosis not present

## 2023-12-12 DIAGNOSIS — M546 Pain in thoracic spine: Secondary | ICD-10-CM

## 2023-12-18 ENCOUNTER — Encounter: Payer: Self-pay | Admitting: Physical Therapy

## 2023-12-18 ENCOUNTER — Ambulatory Visit: Admitting: Physical Therapy

## 2023-12-18 ENCOUNTER — Ambulatory Visit: Attending: Nurse Practitioner | Admitting: Physical Therapy

## 2023-12-18 ENCOUNTER — Telehealth: Payer: Self-pay | Admitting: Physical Therapy

## 2023-12-18 DIAGNOSIS — M5412 Radiculopathy, cervical region: Secondary | ICD-10-CM | POA: Diagnosis present

## 2023-12-18 DIAGNOSIS — M5432 Sciatica, left side: Secondary | ICD-10-CM | POA: Insufficient documentation

## 2023-12-18 DIAGNOSIS — M546 Pain in thoracic spine: Secondary | ICD-10-CM | POA: Diagnosis present

## 2023-12-18 DIAGNOSIS — M5459 Other low back pain: Secondary | ICD-10-CM | POA: Insufficient documentation

## 2023-12-18 DIAGNOSIS — M6281 Muscle weakness (generalized): Secondary | ICD-10-CM | POA: Insufficient documentation

## 2023-12-18 NOTE — Telephone Encounter (Signed)
 Called referring clinician Susette Pae, NP form [facility], [phone]) to let her know pt had come in with an acute L LE radiculopathy and may benefit from assistance from them with a steroid Dosepak or per their recommendation. Left VM on nurses line. Let them know pt denies bowel or bladder changes but has had some trouble with instability on L LE (unclear whether pain inhibition or neurogenic weakness). Also let them know it is something PT is ready to help with but will need PT order that includes the lumbar spine in order to address it further. Let them know ideally a new PT order would have the neck and lumbar spine on it so PT could continue to address neck concerns while also addressing new lumbar concerns.   Camie SAUNDERS. Juli, PT, DPT, Cert. MDT 12/18/23, 4:21 PM  [facility] [address] [place], [place] [address] P: [phone] I F: [phone]
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
## 2023-12-20 ENCOUNTER — Ambulatory Visit: Admitting: Physical Therapy

## 2023-12-20 ENCOUNTER — Encounter: Payer: Self-pay | Admitting: Physical Therapy

## 2023-12-20 DIAGNOSIS — M5412 Radiculopathy, cervical region: Secondary | ICD-10-CM

## 2023-12-20 DIAGNOSIS — M6281 Muscle weakness (generalized): Secondary | ICD-10-CM

## 2023-12-20 DIAGNOSIS — M546 Pain in thoracic spine: Secondary | ICD-10-CM

## 2023-12-20 DIAGNOSIS — M5459 Other low back pain: Secondary | ICD-10-CM

## 2023-12-20 DIAGNOSIS — M5432 Sciatica, left side: Secondary | ICD-10-CM

## 2023-12-25 ENCOUNTER — Ambulatory Visit: Admitting: Physical Therapy
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
## 2023-12-27 ENCOUNTER — Ambulatory Visit: Admitting: Physical Therapy

## 2023-12-27 DIAGNOSIS — M5432 Sciatica, left side: Secondary | ICD-10-CM

## 2023-12-27 DIAGNOSIS — M6281 Muscle weakness (generalized): Secondary | ICD-10-CM

## 2023-12-27 DIAGNOSIS — M5459 Other low back pain: Secondary | ICD-10-CM

## 2023-12-27 DIAGNOSIS — M546 Pain in thoracic spine: Secondary | ICD-10-CM

## 2023-12-27 DIAGNOSIS — M5412 Radiculopathy, cervical region: Secondary | ICD-10-CM

## 2024-01-01 ENCOUNTER — Other Ambulatory Visit: Payer: Self-pay | Admitting: Nurse Practitioner

## 2024-01-01 ENCOUNTER — Ambulatory Visit: Admitting: Physical Therapy

## 2024-01-01 DIAGNOSIS — M5412 Radiculopathy, cervical region: Secondary | ICD-10-CM

## 2024-01-01 DIAGNOSIS — M549 Dorsalgia, unspecified: Secondary | ICD-10-CM

## 2024-01-01 DIAGNOSIS — Z981 Arthrodesis status: Secondary | ICD-10-CM
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
## 2024-01-03 ENCOUNTER — Ambulatory Visit: Admitting: Physical Therapy

## 2024-01-08 ENCOUNTER — Ambulatory Visit: Admitting: Physical Therapy

## 2024-01-09 ENCOUNTER — Ambulatory Visit
Admission: RE | Admit: 2024-01-09 | Discharge: 2024-01-09 | Disposition: A | Discharge status: Home / Self Care | Source: Ambulatory Visit | Attending: Nurse Practitioner | Admitting: Nurse Practitioner

## 2024-01-09 DIAGNOSIS — Z981 Arthrodesis status: Secondary | ICD-10-CM | POA: Diagnosis present

## 2024-01-09 DIAGNOSIS — M5412 Radiculopathy, cervical region: Secondary | ICD-10-CM

## 2024-01-09 DIAGNOSIS — M549 Dorsalgia, unspecified: Secondary | ICD-10-CM | POA: Insufficient documentation

## 2024-01-15 ENCOUNTER — Ambulatory Visit: Admitting: Physical Therapy

## 2024-01-17 ENCOUNTER — Ambulatory Visit: Admitting: Physical Therapy

## 2024-01-22 ENCOUNTER — Ambulatory Visit: Admitting: Physical Therapy

## 2024-01-24 ENCOUNTER — Ambulatory Visit: Admitting: Physical Therapy

## 2024-01-29 ENCOUNTER — Ambulatory Visit: Admitting: Physical Therapy

## 2024-01-31 ENCOUNTER — Ambulatory Visit: Admitting: Physical Therapy
